# Patient Record
Sex: Male | Born: 1950
Health system: Southern US, Community
[De-identification: ages and names within clinical notes are randomized; demographics above are authoritative.]

## PROBLEM LIST (undated history)

## (undated) DIAGNOSIS — K219 Gastro-esophageal reflux disease without esophagitis: Secondary | ICD-10-CM

## (undated) DIAGNOSIS — M1990 Unspecified osteoarthritis, unspecified site: Secondary | ICD-10-CM

## (undated) DIAGNOSIS — I10 Essential (primary) hypertension: Secondary | ICD-10-CM

## (undated) HISTORY — PX: KNEE CARTILAGE SURGERY: SHX688

## (undated) HISTORY — PX: CATARACT EXTRACTION: SUR2

## (undated) HISTORY — PX: KNEE ARTHROSCOPY: SUR90

---

## 2006-03-13 ENCOUNTER — Encounter: Admission: RE | Admit: 2006-03-13 | Discharge: 2006-03-13 | Payer: Self-pay | Admitting: Family Medicine

## 2008-06-08 ENCOUNTER — Encounter: Admission: RE | Admit: 2008-06-08 | Discharge: 2008-06-08 | Payer: Self-pay | Admitting: Family Medicine

## 2010-12-27 ENCOUNTER — Other Ambulatory Visit: Payer: Self-pay | Admitting: Orthopedic Surgery

## 2011-01-02 ENCOUNTER — Encounter (HOSPITAL_COMMUNITY): Payer: Self-pay | Admitting: Pharmacy Technician

## 2011-01-07 ENCOUNTER — Other Ambulatory Visit: Payer: Self-pay

## 2011-01-07 ENCOUNTER — Encounter (HOSPITAL_COMMUNITY): Payer: Self-pay

## 2011-01-07 ENCOUNTER — Encounter (HOSPITAL_COMMUNITY)
Admission: RE | Admit: 2011-01-07 | Discharge: 2011-01-07 | Disposition: A | Discharge status: Home / Self Care | Payer: BC Managed Care – PPO | Source: Ambulatory Visit | Attending: Orthopedic Surgery | Admitting: Orthopedic Surgery

## 2011-01-07 ENCOUNTER — Ambulatory Visit (HOSPITAL_COMMUNITY): Admission: RE | Admit: 2011-01-07 | Payer: BC Managed Care – PPO | Source: Ambulatory Visit

## 2011-01-07 HISTORY — DX: Essential (primary) hypertension: I10

## 2011-01-07 HISTORY — DX: Unspecified osteoarthritis, unspecified site: M19.90

## 2011-01-07 LAB — COMPREHENSIVE METABOLIC PANEL
BUN: 14 mg/dL (ref 6–23)
CO2: 28 mEq/L (ref 19–32)
Chloride: 100 mEq/L (ref 96–112)
Creatinine, Ser: 0.92 mg/dL (ref 0.50–1.35)
GFR calc Af Amer: >90 mL/min (ref >90)
GFR calc non Af Amer: 89 mL/min — ABNORMAL LOW (ref >90)
Glucose, Bld: 122 mg/dL — ABNORMAL HIGH (ref 70–99)
Total Bilirubin: 0.4 mg/dL (ref 0.3–1.2)

## 2011-01-07 LAB — DIFFERENTIAL
Basophils Absolute: 0 10*3/uL (ref 0.0–0.1)
Basophils Relative: 0 % (ref 0–1)
Neutro Abs: 6.4 10*3/uL (ref 1.7–7.7)
Neutrophils Relative %: 61 % (ref 43–77)

## 2011-01-07 LAB — PROTIME-INR: INR: 0.98 (ref 0.00–1.49)

## 2011-01-07 LAB — URINALYSIS, ROUTINE W REFLEX MICROSCOPIC
Bilirubin Urine: NEGATIVE
Glucose, UA: NEGATIVE mg/dL
Ketones, ur: NEGATIVE mg/dL
Nitrite: NEGATIVE
Protein, ur: NEGATIVE mg/dL

## 2011-01-07 LAB — CBC
HCT: 37.9 % — ABNORMAL LOW (ref 39.0–52.0)
MCV: 87.1 fL (ref 78.0–100.0)
RBC: 4.35 MIL/uL (ref 4.22–5.81)
RDW: 12.2 % (ref 11.5–15.5)
WBC: 10.4 10*3/uL (ref 4.0–10.5)

## 2011-01-07 LAB — URINE MICROSCOPIC-ADD ON

## 2011-01-07 NOTE — Pre-Procedure Instructions (Signed)
18 Mitchell Frank  01/07/2011   Your procedure is scheduled on: 01-16-2011 @ 8:45 AM  Report to [facility] at 6:30 AM.  Call this number if you have problems the morning of surgery: [phone]   Remember:   Do not eat food:After Midnight.  May have clear liquids: up to 4 Hours before arrival.  Clear liquids include soda, tea, black coffee, apple or grape juice, broth. Until 2:30 AM  Take these medicines the morning of surgery with A SIP OF WATER Amlodipine,   Do not wear jewelry, make-up or nail polish.  Do not wear lotions, powders, or perfumes. You may wear deodorant.  Do not shave 48 hours prior to surgery.  Do not bring valuables to the hospital.  Contacts, dentures or bridgework may not be worn into surgery.  Leave suitcase in the car. After surgery it may be brought to your room.  For patients admitted to the hospital, checkout time is 11:00 AM the day of discharge.   Patients discharged the day of surgery will not be allowed to drive home.  Name and phone number of your driver:  Special Instructions: CHG Shower Use Special Wash: 1/2 bottle night before surgery and 1/2 bottle morning of surgery.   Please read over the following fact sheets that you were given: Pain Booklet, Blood Transfusion Information, MRSA Information and Surgical Site Infection Prevention

## 2011-01-07 NOTE — Progress Notes (Signed)
Carla from Dr. Marshell Levan office to place new order for consent form in Epic.  Pt. Does  Not to have T&S done until DOS due to his job.he cannot wear the bracelet.

## 2011-01-07 NOTE — Progress Notes (Signed)
Office called for clarification of consent.

## 2011-01-15 MED ORDER — CHLORHEXIDINE GLUCONATE 4 % EX LIQD: 60.0000 mL | Freq: Once | CUTANEOUS | Status: DC

## 2011-01-15 MED ORDER — CEFAZOLIN SODIUM-DEXTROSE 2-3 GM-% IV SOLR
2.0000 g | INTRAVENOUS | Status: AC
  Administered 2011-01-16: 2 g via INTRAVENOUS

## 2011-01-16 ENCOUNTER — Ambulatory Visit (HOSPITAL_COMMUNITY): Payer: BC Managed Care – PPO

## 2011-01-16 ENCOUNTER — Encounter (HOSPITAL_COMMUNITY): Payer: Self-pay | Admitting: *Deleted

## 2011-01-16 ENCOUNTER — Encounter (HOSPITAL_COMMUNITY)
Admission: RE | Disposition: A | Discharge status: Home / Self Care | Payer: Self-pay | Source: Ambulatory Visit | Attending: Orthopedic Surgery

## 2011-01-16 ENCOUNTER — Ambulatory Visit (HOSPITAL_COMMUNITY): Payer: BC Managed Care – PPO | Admitting: Anesthesiology

## 2011-01-16 ENCOUNTER — Encounter (HOSPITAL_COMMUNITY): Payer: Self-pay | Admitting: Anesthesiology

## 2011-01-16 ENCOUNTER — Inpatient Hospital Stay (HOSPITAL_COMMUNITY)
Admission: RE | Admit: 2011-01-16 | Discharge: 2011-01-17 | DRG: 865 | Disposition: A | Discharge status: Home / Self Care | Payer: BC Managed Care – PPO | Source: Ambulatory Visit | Attending: Orthopedic Surgery | Admitting: Orthopedic Surgery

## 2011-01-16 DIAGNOSIS — Z87720 Personal history of (corrected) congenital malformations of eye: Secondary | ICD-10-CM

## 2011-01-16 DIAGNOSIS — M5410 Radiculopathy, site unspecified: Secondary | ICD-10-CM

## 2011-01-16 DIAGNOSIS — Z9889 Other specified postprocedural states: Secondary | ICD-10-CM

## 2011-01-16 DIAGNOSIS — I10 Essential (primary) hypertension: Secondary | ICD-10-CM | POA: Diagnosis present

## 2011-01-16 DIAGNOSIS — M5030 Other cervical disc degeneration, unspecified cervical region: Principal | ICD-10-CM | POA: Diagnosis present

## 2011-01-16 DIAGNOSIS — E119 Type 2 diabetes mellitus without complications: Secondary | ICD-10-CM | POA: Diagnosis present

## 2011-01-16 HISTORY — PX: ANTERIOR CERVICAL DECOMP/DISCECTOMY FUSION: SHX1161

## 2011-01-16 LAB — TYPE AND SCREEN: Antibody Screen: NEGATIVE

## 2011-01-16 LAB — GLUCOSE, CAPILLARY
Glucose-Capillary: 134 mg/dL — ABNORMAL HIGH (ref 70–99)
Glucose-Capillary: 151 mg/dL — ABNORMAL HIGH (ref 70–99)
Glucose-Capillary: 167 mg/dL — ABNORMAL HIGH (ref 70–99)
Glucose-Capillary: 141 mg/dL — ABNORMAL HIGH (ref 70–99)

## 2011-01-16 SURGERY — ANTERIOR CERVICAL DECOMPRESSION/DISCECTOMY FUSION 2 LEVELS
Anesthesia: General | Site: Spine Cervical | Laterality: Left | Wound class: Clean

## 2011-01-16 MED ORDER — HYDROXYZINE HCL 25 MG PO TABS: 50.0000 mg | ORAL_TABLET | ORAL | Status: DC | PRN

## 2011-01-16 MED ORDER — CEFAZOLIN SODIUM 1-5 GM-% IV SOLN
INTRAVENOUS | Status: AC
  Filled 2011-01-16: qty 100

## 2011-01-16 MED ORDER — LISINOPRIL 10 MG PO TABS
10.0000 mg | ORAL_TABLET | Freq: Every day | ORAL | Status: DC
  Administered 2011-01-16: 10 mg via ORAL
  Filled 2011-01-16 (×2): qty 1

## 2011-01-16 MED ORDER — POTASSIUM CHLORIDE IN NACL 20-0.9 MEQ/L-% IV SOLN
INTRAVENOUS | Status: DC
  Administered 2011-01-16: 17:00:00 via INTRAVENOUS
  Filled 2011-01-16 (×3): qty 1000

## 2011-01-16 MED ORDER — CEFAZOLIN SODIUM 1-5 GM-% IV SOLN
1.0000 g | Freq: Three times a day (TID) | INTRAVENOUS | Status: AC
  Administered 2011-01-16 (×2): 1 g via INTRAVENOUS
  Filled 2011-01-16 (×2): qty 50

## 2011-01-16 MED ORDER — PHENOL 1.4 % MT LIQD
1.0000 | OROMUCOSAL | Status: DC | PRN
  Administered 2011-01-16: 1 via OROMUCOSAL
  Filled 2011-01-16: qty 177

## 2011-01-16 MED ORDER — ACETAMINOPHEN 10 MG/ML IV SOLN
INTRAVENOUS | Status: DC | PRN
  Administered 2011-01-16: 1000 mg via INTRAVENOUS

## 2011-01-16 MED ORDER — NEOSTIGMINE METHYLSULFATE 1 MG/ML IJ SOLN
INTRAMUSCULAR | Status: DC | PRN
  Administered 2011-01-16: 3 mg via INTRAVENOUS

## 2011-01-16 MED ORDER — OXYCODONE-ACETAMINOPHEN 5-325 MG PO TABS
1.0000 | ORAL_TABLET | ORAL | Status: DC | PRN
  Administered 2011-01-16: 1 via ORAL
  Filled 2011-01-16: qty 1

## 2011-01-16 MED ORDER — THROMBIN 20000 UNITS EX KIT
PACK | OROMUCOSAL | Status: DC | PRN
  Administered 2011-01-16: 09:00:00 via TOPICAL

## 2011-01-16 MED ORDER — ACETAMINOPHEN 10 MG/ML IV SOLN
INTRAVENOUS | Status: AC
  Filled 2011-01-16: qty 100

## 2011-01-16 MED ORDER — FENTANYL CITRATE 0.05 MG/ML IJ SOLN
INTRAMUSCULAR | Status: DC | PRN
  Administered 2011-01-16: 100 ug via INTRAVENOUS
  Administered 2011-01-16: 150 ug via INTRAVENOUS

## 2011-01-16 MED ORDER — SODIUM CHLORIDE 0.9 % IJ SOLN
3.0000 mL | Freq: Two times a day (BID) | INTRAMUSCULAR | Status: DC
  Administered 2011-01-16 (×2): 3 mL via INTRAVENOUS

## 2011-01-16 MED ORDER — METFORMIN HCL 500 MG PO TABS
1000.0000 mg | ORAL_TABLET | Freq: Every day | ORAL | Status: DC
  Filled 2011-01-16: qty 2

## 2011-01-16 MED ORDER — MENTHOL 3 MG MT LOZG
1.0000 | LOZENGE | OROMUCOSAL | Status: DC | PRN
  Administered 2011-01-16: 3 mg via ORAL
  Filled 2011-01-16: qty 9

## 2011-01-16 MED ORDER — BUPIVACAINE-EPINEPHRINE 0.25% -1:200000 IJ SOLN
INTRAMUSCULAR | Status: DC | PRN
  Administered 2011-01-16: 1 mL

## 2011-01-16 MED ORDER — ACETAMINOPHEN 650 MG RE SUPP: 650.0000 mg | RECTAL | Status: DC | PRN

## 2011-01-16 MED ORDER — METFORMIN HCL 500 MG PO TABS: 500.0000 mg | ORAL_TABLET | ORAL | Status: DC

## 2011-01-16 MED ORDER — MIDAZOLAM HCL 5 MG/5ML IJ SOLN
INTRAMUSCULAR | Status: DC | PRN
  Administered 2011-01-16: 2 mg via INTRAVENOUS

## 2011-01-16 MED ORDER — SODIUM CHLORIDE 0.9 % IJ SOLN: 3.0000 mL | INTRAMUSCULAR | Status: DC | PRN

## 2011-01-16 MED ORDER — 0.9 % SODIUM CHLORIDE (POUR BTL) OPTIME
TOPICAL | Status: DC | PRN
  Administered 2011-01-16: 1000 mL

## 2011-01-16 MED ORDER — METFORMIN HCL 500 MG PO TABS
500.0000 mg | ORAL_TABLET | Freq: Every day | ORAL | Status: DC
  Administered 2011-01-16: 500 mg via ORAL
  Filled 2011-01-16 (×2): qty 1

## 2011-01-16 MED ORDER — HYDROMORPHONE HCL PF 1 MG/ML IJ SOLN: 0.2500 mg | INTRAMUSCULAR | Status: DC | PRN

## 2011-01-16 MED ORDER — MORPHINE SULFATE 2 MG/ML IJ SOLN: 0.0500 mg/kg | INTRAMUSCULAR | Status: DC | PRN

## 2011-01-16 MED ORDER — ROCURONIUM BROMIDE 100 MG/10ML IV SOLN
INTRAVENOUS | Status: DC | PRN
  Administered 2011-01-16 (×4): 10 mg via INTRAVENOUS
  Administered 2011-01-16: 50 mg via INTRAVENOUS

## 2011-01-16 MED ORDER — POVIDONE-IODINE 7.5 % EX SOLN: Freq: Once | CUTANEOUS | Status: DC

## 2011-01-16 MED ORDER — ONDANSETRON HCL 4 MG/2ML IJ SOLN: 4.0000 mg | Freq: Once | INTRAMUSCULAR | Status: DC | PRN

## 2011-01-16 MED ORDER — AMLODIPINE BESYLATE 10 MG PO TABS
10.0000 mg | ORAL_TABLET | Freq: Every day | ORAL | Status: DC
  Administered 2011-01-16: 10 mg via ORAL
  Filled 2011-01-16 (×2): qty 1

## 2011-01-16 MED ORDER — ZOLPIDEM TARTRATE 5 MG PO TABS: 5.0000 mg | ORAL_TABLET | Freq: Every evening | ORAL | Status: DC | PRN

## 2011-01-16 MED ORDER — ACETAMINOPHEN 325 MG PO TABS: 650.0000 mg | ORAL_TABLET | ORAL | Status: DC | PRN

## 2011-01-16 MED ORDER — LACTATED RINGERS IV SOLN
INTRAVENOUS | Status: DC | PRN
  Administered 2011-01-16 (×2): via INTRAVENOUS

## 2011-01-16 MED ORDER — ONDANSETRON HCL 4 MG/2ML IJ SOLN
INTRAMUSCULAR | Status: DC | PRN
  Administered 2011-01-16: 4 mg via INTRAVENOUS

## 2011-01-16 MED ORDER — GLYCOPYRROLATE 0.2 MG/ML IJ SOLN
INTRAMUSCULAR | Status: DC | PRN
  Administered 2011-01-16: .6 mg via INTRAVENOUS

## 2011-01-16 MED ORDER — INSULIN ASPART 100 UNIT/ML ~~LOC~~ SOLN
0.0000 [IU] | Freq: Every day | SUBCUTANEOUS | Status: DC
  Filled 2011-01-16: qty 3

## 2011-01-16 MED ORDER — INSULIN ASPART 100 UNIT/ML ~~LOC~~ SOLN
0.0000 [IU] | Freq: Three times a day (TID) | SUBCUTANEOUS | Status: DC
  Administered 2011-01-16: 3 [IU] via SUBCUTANEOUS
  Administered 2011-01-17: 2 [IU] via SUBCUTANEOUS
  Filled 2011-01-16: qty 3

## 2011-01-16 MED ORDER — MEPERIDINE HCL 25 MG/ML IJ SOLN: 6.2500 mg | INTRAMUSCULAR | Status: DC | PRN

## 2011-01-16 MED ORDER — EPHEDRINE SULFATE 50 MG/ML IJ SOLN
INTRAMUSCULAR | Status: DC | PRN
  Administered 2011-01-16: 5 mg via INTRAVENOUS

## 2011-01-16 MED ORDER — HYDROXYZINE HCL 50 MG/ML IM SOLN: 50.0000 mg | INTRAMUSCULAR | Status: DC | PRN

## 2011-01-16 MED ORDER — MORPHINE SULFATE 4 MG/ML IJ SOLN: 2.0000 mg | INTRAMUSCULAR | Status: DC | PRN

## 2011-01-16 MED ORDER — DIAZEPAM 5 MG PO TABS
5.0000 mg | ORAL_TABLET | Freq: Four times a day (QID) | ORAL | Status: DC | PRN
  Administered 2011-01-16 – 2011-01-17 (×3): 5 mg via ORAL
  Filled 2011-01-16 (×3): qty 1

## 2011-01-16 MED ORDER — PROPOFOL 10 MG/ML IV EMUL
INTRAVENOUS | Status: DC | PRN
  Administered 2011-01-16: 150 mg via INTRAVENOUS

## 2011-01-16 SURGICAL SUPPLY — 75 items
APL SKNCLS STERI-STRIP NONHPOA (GAUZE/BANDAGES/DRESSINGS) ×1
BENZOIN TINCTURE PRP APPL 2/3 (GAUZE/BANDAGES/DRESSINGS) ×2 IMPLANT
BIT DRILL NEURO 2X3.1 SFT TUCH (MISCELLANEOUS) ×1 IMPLANT
BLADE LONG MED 31X9 (MISCELLANEOUS) IMPLANT
BLADE SURG 15 STRL LF DISP TIS (BLADE) ×1 IMPLANT
BLADE SURG 15 STRL SS (BLADE) ×2
BLADE SURG ROTATE 9660 (MISCELLANEOUS) ×2 IMPLANT
BUR NEURO DRILL SOFT 3.0X3.8M (BURR) ×2 IMPLANT
CARTRIDGE OIL MAESTRO DRILL (MISCELLANEOUS) ×1 IMPLANT
CLOTH BEACON ORANGE TIMEOUT ST (SAFETY) ×2 IMPLANT
COLLAR CERV LO CONTOUR FIRM DE (SOFTGOODS) IMPLANT
CORDS BIPOLAR (ELECTRODE) ×2 IMPLANT
COVER SURGICAL LIGHT HANDLE (MISCELLANEOUS) ×2 IMPLANT
CRADLE DONUT ADULT HEAD (MISCELLANEOUS) ×2 IMPLANT
DEVICE ENDSKLTN MED 6 7MM (Orthopedic Implant) IMPLANT
DEVICE ENDSKLTN TC MED 8MM (Orthopedic Implant) IMPLANT
DIFFUSER DRILL AIR PNEUMATIC (MISCELLANEOUS) ×2 IMPLANT
DRAIN JACKSON RD 7FR 3/32 (WOUND CARE) ×1 IMPLANT
DRAPE C-ARM 42X72 X-RAY (DRAPES) ×2 IMPLANT
DRAPE POUCH INSTRU U-SHP 10X18 (DRAPES) ×2 IMPLANT
DRAPE SURG 17X23 STRL (DRAPES) ×6 IMPLANT
DRILL NEURO 2X3.1 SOFT TOUCH (MISCELLANEOUS) ×2
DURAPREP 6ML APPLICATOR 50/CS (WOUND CARE) ×2 IMPLANT
ELECT COATED BLADE 2.86 ST (ELECTRODE) ×2 IMPLANT
ELECT REM PT RETURN 9FT ADLT (ELECTROSURGICAL) ×2
ELECTRODE REM PT RTRN 9FT ADLT (ELECTROSURGICAL) ×1 IMPLANT
ENDOSKELETON MED 6 7MM (Orthopedic Implant) ×2 IMPLANT
ENDOSKELTON TC IMPLANT 8MM MED (Orthopedic Implant) ×2 IMPLANT
EVACUATOR SILICONE 100CC (DRAIN) ×1 IMPLANT
GAUZE SPONGE 4X4 16PLY XRAY LF (GAUZE/BANDAGES/DRESSINGS) ×1 IMPLANT
GLOVE BIO SURGEON STRL SZ8 (GLOVE) ×2 IMPLANT
GLOVE BIOGEL PI IND STRL 8 (GLOVE) ×1 IMPLANT
GLOVE BIOGEL PI INDICATOR 8 (GLOVE) ×1
GOWN PREVENTION PLUS XLARGE (GOWN DISPOSABLE) ×5 IMPLANT
GOWN STRL NON-REIN LRG LVL3 (GOWN DISPOSABLE) ×1 IMPLANT
IV CATH 14GX2 1/4 (CATHETERS) ×2 IMPLANT
KIT BASIN OR (CUSTOM PROCEDURE TRAY) ×2 IMPLANT
KIT ROOM TURNOVER OR (KITS) ×2 IMPLANT
MANIFOLD NEPTUNE II (INSTRUMENTS) ×2 IMPLANT
NDL SPNL 20GX3.5 QUINCKE YW (NEEDLE) ×1 IMPLANT
NEEDLE 27GAX1X1/2 (NEEDLE) ×2 IMPLANT
NEEDLE SPNL 20GX3.5 QUINCKE YW (NEEDLE) ×2 IMPLANT
NS IRRIG 1000ML POUR BTL (IV SOLUTION) ×2 IMPLANT
OIL CARTRIDGE MAESTRO DRILL (MISCELLANEOUS) ×2
PACK ORTHO CERVICAL (CUSTOM PROCEDURE TRAY) ×2 IMPLANT
PAD ARMBOARD 7.5X6 YLW CONV (MISCELLANEOUS) ×4 IMPLANT
PATTIES SURGICAL .5 X.5 (GAUZE/BANDAGES/DRESSINGS) ×1 IMPLANT
PATTIES SURGICAL .5 X1 (DISPOSABLE) IMPLANT
PIN RETAINER PRODISC 14 MM (PIN) ×2 IMPLANT
PLATE VECTRA (Plate) ×1 IMPLANT
PUTTY BONE DBX 2.5 MIS (Bone Implant) ×1 IMPLANT
RETAINER SCREW ×2 IMPLANT
SCREW 4.5X16MM (Screw) ×12 IMPLANT
SCREW BN 16X4.5XSLF DRL VA (Screw) IMPLANT
SPONGE GAUZE 4X4 12PLY (GAUZE/BANDAGES/DRESSINGS) ×2 IMPLANT
SPONGE INTESTINAL PEANUT (DISPOSABLE) ×4 IMPLANT
SPONGE SURGIFOAM ABS GEL 100 (HEMOSTASIS) ×2 IMPLANT
STRIP CLOSURE SKIN 1/2X4 (GAUZE/BANDAGES/DRESSINGS) ×2 IMPLANT
SURGIFLO TRUKIT (HEMOSTASIS) IMPLANT
SUT MNCRL AB 4-0 PS2 18 (SUTURE) ×2 IMPLANT
SUT SILK 2 0 TIES 10X30 (SUTURE) ×1 IMPLANT
SUT SILK 4 0 (SUTURE)
SUT SILK 4-0 18XBRD TIE 12 (SUTURE) IMPLANT
SUT VIC AB 1 CT1 27 (SUTURE)
SUT VIC AB 1 CT1 27XBRD ANBCTR (SUTURE) ×1 IMPLANT
SUT VIC AB 2-0 CT2 18 VCP726D (SUTURE) ×2 IMPLANT
SYR BULB IRRIGATION 50ML (SYRINGE) ×2 IMPLANT
SYR CONTROL 10ML LL (SYRINGE) ×2 IMPLANT
TAPE CLOTH 4X10 WHT NS (GAUZE/BANDAGES/DRESSINGS) ×1 IMPLANT
TAPE CLOTH SURG 4X10 WHT LF (GAUZE/BANDAGES/DRESSINGS) ×1 IMPLANT
TAPE UMBILICAL COTTON 1/8X30 (MISCELLANEOUS) ×2 IMPLANT
TOWEL OR 17X24 6PK STRL BLUE (TOWEL DISPOSABLE) ×2 IMPLANT
TOWEL OR 17X26 10 PK STRL BLUE (TOWEL DISPOSABLE) ×2 IMPLANT
WATER STERILE IRR 1000ML POUR (IV SOLUTION) ×2 IMPLANT
YANKAUER SUCT BULB TIP NO VENT (SUCTIONS) ×2 IMPLANT

## 2011-01-16 NOTE — Anesthesia Postprocedure Evaluation (Signed)
  Anesthesia Post-op Note  Patient: Mitchell Frank  Procedure(s) Performed:  ANTERIOR CERVICAL DECOMPRESSION/DISCECTOMY FUSION 2 LEVELS - Anterior cervical decompression fusion, cervical 3-4, cervical 4-5 with instrumentation and allograft.  Patient Location: PACU  Anesthesia Type: General  Level of Consciousness: awake, alert  and oriented  Airway and Oxygen Therapy: Patient Spontanous Breathing and Patient connected to face mask oxygen  Post-op Pain: mild  Post-op Assessment: Post-op Vital signs reviewed, Patient's Cardiovascular Status Stable, Respiratory Function Stable, Patent Airway, No signs of Nausea or vomiting and Pain level controlled  Post-op Vital Signs: Reviewed and stable  Complications: No apparent anesthesia complications

## 2011-01-16 NOTE — Transfer of Care (Signed)
Immediate Anesthesia Transfer of Care Note  Patient: Mitchell Frank  Procedure(s) Performed:  ANTERIOR CERVICAL DECOMPRESSION/DISCECTOMY FUSION 2 LEVELS - Anterior cervical decompression fusion, cervical 3-4, cervical 4-5 with instrumentation and allograft.  Patient Location: PACU  Anesthesia Type: General  Level of Consciousness: sedated and patient cooperative  Airway & Oxygen Therapy: Patient Spontanous Breathing and Patient connected to face mask oxygen  Post-op Assessment: Report given to PACU RN and Post -op Vital signs reviewed and stable  Post vital signs: Reviewed and stable  Complications: No apparent anesthesia complications

## 2011-01-16 NOTE — Anesthesia Preprocedure Evaluation (Addendum)
Anesthesia Evaluation  Patient identified by MRN, date of birth, ID band Patient awake    Reviewed: Allergy & Precautions, H&P , NPO status , Patient's Chart, lab work & pertinent test results  Airway Mallampati: I TM Distance: >3 FB Neck ROM: Full    Dental No notable dental hx. (+) Teeth Intact and Dental Advisory Given   Pulmonary          Cardiovascular hypertension, Regular Normal    Neuro/Psych    GI/Hepatic   Endo/Other  Diabetes mellitus-, Well Controlled, Type 2, Oral Hypoglycemic Agents  Renal/GU      Musculoskeletal   Abdominal   Peds  Hematology   Anesthesia Other Findings   Reproductive/Obstetrics                          Anesthesia Physical Anesthesia Plan  ASA: II  Anesthesia Plan: General   Post-op Pain Management:    Induction: Intravenous  Airway Management Planned: Oral ETT  Additional Equipment:   Intra-op Plan:   Post-operative Plan: Extubation in OR  Informed Consent: I have reviewed the patients History and Physical, chart, labs and discussed the procedure including the risks, benefits and alternatives for the proposed anesthesia with the patient or authorized representative who has indicated his/her understanding and acceptance.   Dental advisory given  Plan Discussed with: CRNA, Anesthesiologist and Surgeon  Anesthesia Plan Comments:         Anesthesia Quick Evaluation

## 2011-01-16 NOTE — H&P (Signed)
PREOPERATIVE H&P  Chief Complaint: left arm pain    HPI: Mitchell Frank is a 60 y.o. male who presents for with many year history of pain in left shoulder and arm  Past Medical History  Diagnosis Date  . Diabetes mellitus   . Hypertension   . Arthritis    Past Surgical History  Procedure Date  . Knee cartilage surgery     left knee  . Knee arthroscopy     right  . Cataract extraction     right   History   Social History  . Marital Status: Married    Spouse Name: N/A    Number of Children: N/A  . Years of Education: N/A   Social History Main Topics  . Smoking status: Never Smoker   . Smokeless tobacco: None  . Alcohol Use: No  . Drug Use: No  . Sexually Active:    Other Topics Concern  . None   Social History Narrative  . None   History reviewed. No pertinent family history. Allergies  Allergen Reactions  . Sulfa Antibiotics Other (See Comments)    BLADDER ISSUES   Prior to Admission medications   Medication Sig Start Date End Date Taking? Authorizing Provider  amLODipine (NORVASC) 10 MG tablet Take 10 mg by mouth daily.     Yes Historical Provider, MD  fish oil-omega-3 fatty acids 1000 MG capsule Take 2,400 mg by mouth daily.     Yes Historical Provider, MD  lisinopril (PRINIVIL,ZESTRIL) 10 MG tablet Take 10 mg by mouth daily.     Yes Historical Provider, MD  metFORMIN (GLUCOPHAGE) 500 MG tablet Take 500 mg by mouth as directed. 2 TABS AT LUNCH 1 TAB IN EVENING    Yes Historical Provider, MD  metroNIDAZOLE (METROGEL) 0.75 % gel Apply 1 application topically 2 (two) times daily as needed. FOR ROSACEA OUTBREAK (FACE)    Yes Historical Provider, MD     All other systems have been reviewed and were otherwise negative with the exception of those mentioned in the HPI and as above.  Physical Exam: There were no vitals filed for this visit.  General: Alert, no acute distress Cardiovascular: No pedal edema Respiratory: No cyanosis, no use of accessory  musculature GI: No organomegaly, abdomen is soft and non-tender Skin: No lesions in the area of chief complaint Neurologic: Sensation intact distally Psychiatric: Patient is competent for consent with normal mood and affect Lymphatic: No axillary or cervical lymphadenopathy  MUSCULOSKELETAL: 5/5 strength bilaterally  Assessment/Plan: Cervical radiculopathy Plan for Procedure(s): ANTERIOR CERVICAL DECOMPRESSION/DISCECTOMY FUSION C3-C5   Pema Thomure LEONARD 01/16/2011 8:12 AM

## 2011-01-16 NOTE — OR Nursing (Signed)
Arrived pacu lethargic and poor resp effort depsite prodding...changed to Sm from nasal cannula and responding to verbal stim would deep breathe and cough

## 2011-01-16 NOTE — Anesthesia Procedure Notes (Signed)
Procedure Name: Intubation Date/Time: 01/16/2011 8:47 AM Performed by: Gwenyth Allegra Pre-anesthesia Checklist: Patient identified, Timeout performed, Emergency Drugs available, Suction available and Patient being monitored Patient Re-evaluated:Patient Re-evaluated prior to inductionOxygen Delivery Method: Circle System Utilized Preoxygenation: Pre-oxygenation with 100% oxygen Intubation Type: IV induction Ventilation: Mask ventilation without difficulty and Oral airway inserted - appropriate to patient size Laryngoscope Size: Mac and 3 Tube size: 7.5 mm Number of attempts: 1 Placement Confirmation: ETT inserted through vocal cords under direct vision,  breath sounds checked- equal and bilateral and positive ETCO2 Secured at: 22 cm Tube secured with: Tape Dental Injury: Teeth and Oropharynx as per pre-operative assessment

## 2011-01-16 NOTE — Preoperative (Signed)
Beta Blockers   Reason not to administer Beta Blockers:Not Applicable 

## 2011-01-16 NOTE — OR Nursing (Signed)
Relates neck only hurts when he has tocough

## 2011-01-16 NOTE — OR Nursing (Signed)
Seen by dr crews and signed out / resp effort much improved as are spontaneous interactions with staff

## 2011-01-17 ENCOUNTER — Encounter (HOSPITAL_COMMUNITY): Payer: Self-pay | Admitting: Orthopedic Surgery

## 2011-01-17 LAB — GLUCOSE, CAPILLARY: Glucose-Capillary: 133 mg/dL — ABNORMAL HIGH (ref 70–99)

## 2011-01-17 MED ORDER — OXYCODONE-ACETAMINOPHEN 5-325 MG PO TABS: 1.0000 | ORAL_TABLET | ORAL | Status: AC | PRN

## 2011-01-17 MED ORDER — DIAZEPAM 5 MG PO TABS: 5.0000 mg | ORAL_TABLET | Freq: Four times a day (QID) | ORAL | Status: AC | PRN

## 2011-01-17 NOTE — Progress Notes (Signed)
PT well.  Minimal neck discomfort. + throat discomfort Left arm pain resolved.  AVSS Collar well appplied Dressing CDI  POD #1 after C34, C45 ACDF   - d/c home today - follow-up in 2 weeks - aspen at all times

## 2011-01-17 NOTE — Plan of Care (Signed)
Problem: Phase II Progression Outcomes Goal: Understands assist devices with ambulation Outcome: Completed/Met Date Met:  01/17/11 +

## 2011-01-17 NOTE — Discharge Summary (Signed)
NAMEADIS, STURGILL NO.:  [id number]  MEDICAL RECORD NO.:  [id number]  LOCATION:  3526                         FACILITY:  [facility]  PHYSICIAN:  Estill Bamberg, MD      DATE OF BIRTH:  1950-08-12  DATE OF ADMISSION:  01/16/2011 DATE OF DISCHARGE:  01/17/2011                              DISCHARGE SUMMARY   ADMISSION DIAGNOSES: 1. Left-sided cervical radiculopathy. 2. C3-4, C4-5 degenerative disk disease.  DISCHARGE DIAGNOSES: 1. Left-sided cervical radiculopathy. 2. C3-4, C4-5 degenerative disk disease.  ADMITTING PHYSICIAN:  Estill Bamberg, MD  ADMISSION HISTORY:  Briefly, Mr. Mitchell Frank is a very pleasant 60 year old male who presented to me with severe pain in the left arm.  I did review an MRI which was notable for degenerative disk disease associated with the C3-4 and C4-5 levels.  The patient was clearly noted to have significant neural foraminal stenosis on the left side at the C3-4 level and was noted to have a congenital fusion at C5-6.  The patient was therefore admitted on January 16, 2011 for a 2-level anterior cervical decompression and fusion.  HOSPITAL COURSE:  On January 16, 2011, the patient underwent the procedure noted above.  The patient tolerated the procedure well and was transferred to recovery in stable condition.  The patient was ultimately transferred to the general floor in stable condition.  A hard Aspen cervical collar was placed postoperatively.  The patient was evaluated by me on the morning of postop day #1.  The patient was clear in stating that his previous radicular pain in the left arm was entirely resolved. He did have some minimal neck discomfort and swallowing difficulty, as expected.  The patient's pain was well controlled and the patient was uneventfully discharged home on the morning of postoperative day #1.  DISCHARGE INSTRUCTIONS:  The patient will wear a hard Aspen cervical collar at all times.  The patient will  avoid lifting over 10 pounds. The patient will take Percocet for pain and Valium for spasms.  The patient will follow up in my office in approximately 2 weeks after his procedure.    Estill Bamberg, MD    MD/MEDQ  D:  01/17/2011  T:  01/17/2011  Job:  [id number]  cc:   Enrigue Catena

## 2011-01-17 NOTE — Op Note (Signed)
Mitchell Frank, Mitchell Frank NO.:  [id number]  MEDICAL RECORD NO.:  [id number]  LOCATION:  3526                         FACILITY:  [facility]  PHYSICIAN:  Estill Bamberg, MD      DATE OF BIRTH:  05-11-1950  DATE OF PROCEDURE:  01/16/2011 DATE OF DISCHARGE:                              OPERATIVE REPORT   PREOPERATIVE DIAGNOSES: 1. C3-4, C4-5 degenerative disk disease. 2. Left-sided C4 radiculopathy. 3. 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  POSTOPERATIVE DIAGNOSES: 1. C3-4, C4-5 degenerative disk disease. 2. Left-sided C4 radiculopathy. 3. 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  PROCEDURES: 1. C3-4, C4-5 anterior cervical decompression and fusion. 2. Placement of interbody device x2 (Titan lordotic interbody cages, 7     mm at C4-5 and 8 mm at C3-4). 3. Use of local autograft. 4. Use of morselized allograft. 5. Placement of anterior instrumentation C3, C4, C5. 6. Intraoperative use of fluoroscopy.  SURGEON:  Estill Bamberg, MD  ASSISTANT:  Janace Litten, PA  ANESTHESIA:  General endotracheal anesthesia.  COMPLICATIONS:  None.  DISPOSITION:  Stable.  ESTIMATED BLOOD LOSS:  Minimal.  INDICATIONS FOR PROCEDURE:  Briefly, Mr. Suen is a very pleasant 60- year-old male, who initially presented to me on November 09, 2010 with significant pain in the region of his left shoulder going into his arm. I did review an MRI which was notable for rather significant foraminal stenosis at the C3-4 level on the left side.  The patient was also noted to have a congenital fusion associated with the C5-6 level.  I did feel the patient's symptoms were likely related to his left-sided C4 radiculopathy and the patient did go forward with getting a left-sided L4 selective nerve block.  The patient stated that the nerve block did entirely alleviate the symptoms in his left shoulder and left arm.  I therefore did feel confident that his symptoms were secondary to left- sided L4 insert a left-sided C4  radiculopathy.  Given the patient's failure of conservative care and his ongoing discomfort, we did have a discussion regarding going forward with an anterior-cervical decompression and fusion involving the C3-4 and C4-5 levels.  The patient fully understood the risks and limitations of the procedure as outlined in my preoperative note.  OPERATIVE DETAILS:  On January 16, 2011, the patient was brought to Surgery, and general endotracheal anesthesia was administered.  The patient was placed supine on a well-padded hospital bed.  All bony prominences were meticulously padded and the patient's arms were secured to the sides.  The neck was placed in a gentle degree of extension.  I then prepped and draped the neck in the usual sterile fashion.  SCDs were placed and time-out procedure was performed.  I then brought in lateral fluoroscopy in order to help optimize the location of the patient's incision.  I then made a left-sided transverse incision from the midline to the medial border of the sternocleidomastoid muscle, centered over the C4 vertebral body.  I then meticulously developed the plane.  I then took down the platysma and then went forward with meticulously developing a plane between the sternocleidomastoid muscle laterally and the strap muscles medially.  The carotid artery was palpated and retracted bilaterally.  The anterior cervical spine  was readily noted.  I then placed a 20-gauge spinal needle into the C4-5 interspace, which did confirm the appropriate operative levels.  I then subperiosteally exposed the vertebral bodies of C3, C4, and C5 out to the uncovertebral joints bilaterally.  I then selected 40 mm retractor blades and a self-retaining Shadow Line retractor was placed and centered over the C4-5 interspace.  I then used a #15 blade knife and performed a preliminary diskectomy, removing the anterior two-thirds of the disk.  I then went forward with a more thorough  diskectomy and did entirely remove the intervertebral disk.  The endplates of C4 and C5 were gently prepared using a bur and a rasp.  Of note, the diskectomy was performed as posteriorly as the posterior longitudinal ligament.  I then went forward placing a series of trials and I did feel that a lordotic 7 mm trial would be the most appropriate fit.  I then packed the interbody trial with autograft obtained from removing anterior osteophytes.  Allograft bone in the form of DBX mixed from the musculoskeletal transplant foundation was also packed into the interbody device.  The interbody device was then tamped into position under distraction.  I was very happy with the final press fit.  I then turned my attention towards the C3-4 interspace.  A diskectomy was performed in the manner described previously.  At this particular level, when the posterior longitudinal ligament was encountered, I did gain access through the posterior longitudinal ligament.  I then used a #1 and then a #2 Kerrison to thoroughly remove the posterior longitudinal ligament out to both the right and the left uncovertebral joints in order to confirm that there was adequate decompression of the exiting C4 nerves bilaterally.  Of note, this was done under distraction with Caspar pins placed into the C3 and C4 vertebral bodies.  At the termination of this portion of the procedure, I was easily able to pass a nerve hook out the neural foramen on the right and the left sides at the C3-4 level.  I then went forward placing a series of trials and I did feel that an 8 mm lordotic medium tightened interbody device would be the most appropriate fit.  This was again packed with autograft and allograft as previously described.  With distraction across the Caspar pins, I did tamp an 8 mm lordotic trial to the appropriate position and again, I did note an excellent press fit.  The endplates were prepared using a rasp and a bur prior  to placement of the interbody device.  I then chose an appropriately sized Synthes Vectra plate.  The plate was bent into the appropriate amount of lordotic curvature.  The plate was then placed over the anterior cervical spine and I went forward placing a 4.5 mm x 16 mm self-drilling, self-tapping variable angle screws into the C3, C4, and C5 vertebral bodies, 2 in each vertebral body.  Of note, the patient's bone was noted to be relatively osteoporotic and I did therefore make a decision to go forward with the 4.5 mm screws in order to help optimize purchase of the vertebral bodies.  Screws were placed under both AP and lateral fluoroscopy to confirm appropriate positioning of the hardware.  At this point, all bleeding was controlled using bipolar electrocautery.  A lateral fluoroscopic view did confirm that the plate was appropriately applied, as were the interbody implants.  I then copiously irrigated the wound.  The platysma was closed using 2-0 Vicryl and the  skin was closed using 3-0 Monocryl.  Benzoin and Steri- Strips were applied followed by a sterile dressing.  All instrument counts were correct at the termination of the procedure.  Of note, Janace Litten was my assistant throughout the procedure and aided in essential retraction and suctioning required throughout the surgery.     Estill Bamberg, MD     MD/MEDQ  D:  01/16/2011  T:  01/16/2011  Job:  [id number]  cc:   Lupe Carney

## 2011-01-17 NOTE — Progress Notes (Signed)
Incision without s/s of infection and pt's pain controlled with oral pain medication.  Pt c/o difficulty swallowing as expected and pt stated that he told Dr. Yevette Edwards.  Pt able to swallow liquids and encouraged to eat soft foods at home.  Discharge instructions given.

## 2013-06-04 DIAGNOSIS — N529 Male erectile dysfunction, unspecified: Secondary | ICD-10-CM | POA: Diagnosis not present

## 2013-06-04 DIAGNOSIS — E119 Type 2 diabetes mellitus without complications: Secondary | ICD-10-CM | POA: Diagnosis not present

## 2013-06-04 DIAGNOSIS — Z299 Encounter for prophylactic measures, unspecified: Secondary | ICD-10-CM | POA: Diagnosis not present

## 2013-06-04 DIAGNOSIS — I10 Essential (primary) hypertension: Secondary | ICD-10-CM | POA: Diagnosis not present

## 2013-06-04 DIAGNOSIS — E7800 Pure hypercholesterolemia, unspecified: Secondary | ICD-10-CM | POA: Diagnosis not present

## 2013-06-04 DIAGNOSIS — L719 Rosacea, unspecified: Secondary | ICD-10-CM | POA: Diagnosis not present

## 2013-06-04 DIAGNOSIS — Z23 Encounter for immunization: Secondary | ICD-10-CM | POA: Diagnosis not present

## 2013-06-04 DIAGNOSIS — C61 Malignant neoplasm of prostate: Secondary | ICD-10-CM | POA: Diagnosis not present

## 2013-06-04 DIAGNOSIS — M25519 Pain in unspecified shoulder: Secondary | ICD-10-CM | POA: Diagnosis not present

## 2013-08-31 DIAGNOSIS — H40019 Open angle with borderline findings, low risk, unspecified eye: Secondary | ICD-10-CM | POA: Diagnosis not present

## 2013-08-31 DIAGNOSIS — E119 Type 2 diabetes mellitus without complications: Secondary | ICD-10-CM | POA: Diagnosis not present

## 2013-08-31 DIAGNOSIS — H26499 Other secondary cataract, unspecified eye: Secondary | ICD-10-CM | POA: Diagnosis not present

## 2013-08-31 DIAGNOSIS — H2510 Age-related nuclear cataract, unspecified eye: Secondary | ICD-10-CM | POA: Diagnosis not present

## 2013-09-01 DIAGNOSIS — M7500 Adhesive capsulitis of unspecified shoulder: Secondary | ICD-10-CM | POA: Diagnosis not present

## 2013-09-03 DIAGNOSIS — D219 Benign neoplasm of connective and other soft tissue, unspecified: Secondary | ICD-10-CM | POA: Diagnosis not present

## 2013-09-03 DIAGNOSIS — D239 Other benign neoplasm of skin, unspecified: Secondary | ICD-10-CM | POA: Diagnosis not present

## 2013-09-03 DIAGNOSIS — L821 Other seborrheic keratosis: Secondary | ICD-10-CM | POA: Diagnosis not present

## 2013-09-03 DIAGNOSIS — D1801 Hemangioma of skin and subcutaneous tissue: Secondary | ICD-10-CM | POA: Diagnosis not present

## 2013-09-03 DIAGNOSIS — L219 Seborrheic dermatitis, unspecified: Secondary | ICD-10-CM | POA: Diagnosis not present

## 2013-09-03 DIAGNOSIS — L570 Actinic keratosis: Secondary | ICD-10-CM | POA: Diagnosis not present

## 2013-09-03 DIAGNOSIS — L7211 Pilar cyst: Secondary | ICD-10-CM | POA: Diagnosis not present

## 2013-10-04 DIAGNOSIS — C61 Malignant neoplasm of prostate: Secondary | ICD-10-CM | POA: Diagnosis not present

## 2013-10-08 DIAGNOSIS — N400 Enlarged prostate without lower urinary tract symptoms: Secondary | ICD-10-CM | POA: Diagnosis not present

## 2013-10-08 DIAGNOSIS — C61 Malignant neoplasm of prostate: Secondary | ICD-10-CM | POA: Diagnosis not present

## 2013-10-18 DIAGNOSIS — Z23 Encounter for immunization: Secondary | ICD-10-CM | POA: Diagnosis not present

## 2013-12-07 DIAGNOSIS — E780 Pure hypercholesterolemia: Secondary | ICD-10-CM | POA: Diagnosis not present

## 2013-12-07 DIAGNOSIS — I10 Essential (primary) hypertension: Secondary | ICD-10-CM | POA: Diagnosis not present

## 2013-12-07 DIAGNOSIS — L719 Rosacea, unspecified: Secondary | ICD-10-CM | POA: Diagnosis not present

## 2013-12-07 DIAGNOSIS — E119 Type 2 diabetes mellitus without complications: Secondary | ICD-10-CM | POA: Diagnosis not present

## 2014-04-18 DIAGNOSIS — C61 Malignant neoplasm of prostate: Secondary | ICD-10-CM | POA: Diagnosis not present

## 2014-04-28 DIAGNOSIS — N400 Enlarged prostate without lower urinary tract symptoms: Secondary | ICD-10-CM | POA: Diagnosis not present

## 2014-04-28 DIAGNOSIS — C61 Malignant neoplasm of prostate: Secondary | ICD-10-CM | POA: Diagnosis not present

## 2014-06-10 DIAGNOSIS — I10 Essential (primary) hypertension: Secondary | ICD-10-CM | POA: Diagnosis not present

## 2014-06-10 DIAGNOSIS — L719 Rosacea, unspecified: Secondary | ICD-10-CM | POA: Diagnosis not present

## 2014-06-10 DIAGNOSIS — E119 Type 2 diabetes mellitus without complications: Secondary | ICD-10-CM | POA: Diagnosis not present

## 2014-06-10 DIAGNOSIS — E780 Pure hypercholesterolemia: Secondary | ICD-10-CM | POA: Diagnosis not present

## 2014-08-19 DIAGNOSIS — C44329 Squamous cell carcinoma of skin of other parts of face: Secondary | ICD-10-CM | POA: Diagnosis not present

## 2014-08-19 DIAGNOSIS — L7211 Pilar cyst: Secondary | ICD-10-CM | POA: Diagnosis not present

## 2014-09-09 DIAGNOSIS — H2512 Age-related nuclear cataract, left eye: Secondary | ICD-10-CM | POA: Diagnosis not present

## 2014-09-09 DIAGNOSIS — E119 Type 2 diabetes mellitus without complications: Secondary | ICD-10-CM | POA: Diagnosis not present

## 2014-09-09 DIAGNOSIS — H5213 Myopia, bilateral: Secondary | ICD-10-CM | POA: Diagnosis not present

## 2014-09-13 DIAGNOSIS — C44329 Squamous cell carcinoma of skin of other parts of face: Secondary | ICD-10-CM | POA: Diagnosis not present

## 2014-09-13 DIAGNOSIS — Z85828 Personal history of other malignant neoplasm of skin: Secondary | ICD-10-CM | POA: Diagnosis not present

## 2014-10-28 DIAGNOSIS — L718 Other rosacea: Secondary | ICD-10-CM | POA: Diagnosis not present

## 2014-10-28 DIAGNOSIS — C61 Malignant neoplasm of prostate: Secondary | ICD-10-CM | POA: Diagnosis not present

## 2014-10-28 DIAGNOSIS — L570 Actinic keratosis: Secondary | ICD-10-CM | POA: Diagnosis not present

## 2014-10-28 DIAGNOSIS — Z85828 Personal history of other malignant neoplasm of skin: Secondary | ICD-10-CM | POA: Diagnosis not present

## 2014-10-28 DIAGNOSIS — L821 Other seborrheic keratosis: Secondary | ICD-10-CM | POA: Diagnosis not present

## 2014-10-28 DIAGNOSIS — L218 Other seborrheic dermatitis: Secondary | ICD-10-CM | POA: Diagnosis not present

## 2014-11-04 DIAGNOSIS — C61 Malignant neoplasm of prostate: Secondary | ICD-10-CM | POA: Diagnosis not present

## 2014-12-30 DIAGNOSIS — E7800 Pure hypercholesterolemia, unspecified: Secondary | ICD-10-CM | POA: Diagnosis not present

## 2014-12-30 DIAGNOSIS — Z23 Encounter for immunization: Secondary | ICD-10-CM | POA: Diagnosis not present

## 2014-12-30 DIAGNOSIS — L719 Rosacea, unspecified: Secondary | ICD-10-CM | POA: Diagnosis not present

## 2014-12-30 DIAGNOSIS — E119 Type 2 diabetes mellitus without complications: Secondary | ICD-10-CM | POA: Diagnosis not present

## 2014-12-30 DIAGNOSIS — I10 Essential (primary) hypertension: Secondary | ICD-10-CM | POA: Diagnosis not present

## 2014-12-30 DIAGNOSIS — Z1211 Encounter for screening for malignant neoplasm of colon: Secondary | ICD-10-CM | POA: Diagnosis not present

## 2014-12-30 DIAGNOSIS — Z7984 Long term (current) use of oral hypoglycemic drugs: Secondary | ICD-10-CM | POA: Diagnosis not present

## 2015-02-13 DIAGNOSIS — Z1211 Encounter for screening for malignant neoplasm of colon: Secondary | ICD-10-CM | POA: Diagnosis not present

## 2015-02-13 DIAGNOSIS — R1310 Dysphagia, unspecified: Secondary | ICD-10-CM | POA: Diagnosis not present

## 2015-03-01 DIAGNOSIS — K210 Gastro-esophageal reflux disease with esophagitis: Secondary | ICD-10-CM | POA: Diagnosis not present

## 2015-03-01 DIAGNOSIS — K2950 Unspecified chronic gastritis without bleeding: Secondary | ICD-10-CM | POA: Diagnosis not present

## 2015-03-01 DIAGNOSIS — R1310 Dysphagia, unspecified: Secondary | ICD-10-CM | POA: Diagnosis not present

## 2015-03-01 DIAGNOSIS — Z1211 Encounter for screening for malignant neoplasm of colon: Secondary | ICD-10-CM | POA: Diagnosis not present

## 2015-03-01 DIAGNOSIS — K208 Other esophagitis: Secondary | ICD-10-CM | POA: Diagnosis not present

## 2015-03-01 DIAGNOSIS — K621 Rectal polyp: Secondary | ICD-10-CM | POA: Diagnosis not present

## 2015-07-07 DIAGNOSIS — L719 Rosacea, unspecified: Secondary | ICD-10-CM | POA: Diagnosis not present

## 2015-07-07 DIAGNOSIS — E7800 Pure hypercholesterolemia, unspecified: Secondary | ICD-10-CM | POA: Diagnosis not present

## 2015-07-07 DIAGNOSIS — E119 Type 2 diabetes mellitus without complications: Secondary | ICD-10-CM | POA: Diagnosis not present

## 2015-07-07 DIAGNOSIS — I10 Essential (primary) hypertension: Secondary | ICD-10-CM | POA: Diagnosis not present

## 2015-07-07 DIAGNOSIS — Z7984 Long term (current) use of oral hypoglycemic drugs: Secondary | ICD-10-CM | POA: Diagnosis not present

## 2015-07-28 DIAGNOSIS — C61 Malignant neoplasm of prostate: Secondary | ICD-10-CM | POA: Diagnosis not present

## 2015-08-04 DIAGNOSIS — C61 Malignant neoplasm of prostate: Secondary | ICD-10-CM | POA: Diagnosis not present

## 2015-08-04 DIAGNOSIS — N400 Enlarged prostate without lower urinary tract symptoms: Secondary | ICD-10-CM | POA: Diagnosis not present

## 2015-11-17 DIAGNOSIS — D225 Melanocytic nevi of trunk: Secondary | ICD-10-CM | POA: Diagnosis not present

## 2015-11-17 DIAGNOSIS — L7211 Pilar cyst: Secondary | ICD-10-CM | POA: Diagnosis not present

## 2015-11-17 DIAGNOSIS — D485 Neoplasm of uncertain behavior of skin: Secondary | ICD-10-CM | POA: Diagnosis not present

## 2015-11-17 DIAGNOSIS — Z85828 Personal history of other malignant neoplasm of skin: Secondary | ICD-10-CM | POA: Diagnosis not present

## 2015-11-17 DIAGNOSIS — D224 Melanocytic nevi of scalp and neck: Secondary | ICD-10-CM | POA: Diagnosis not present

## 2015-11-17 DIAGNOSIS — L821 Other seborrheic keratosis: Secondary | ICD-10-CM | POA: Diagnosis not present

## 2015-11-17 DIAGNOSIS — L218 Other seborrheic dermatitis: Secondary | ICD-10-CM | POA: Diagnosis not present

## 2015-11-17 DIAGNOSIS — D1801 Hemangioma of skin and subcutaneous tissue: Secondary | ICD-10-CM | POA: Diagnosis not present

## 2015-11-17 DIAGNOSIS — L718 Other rosacea: Secondary | ICD-10-CM | POA: Diagnosis not present

## 2015-11-17 DIAGNOSIS — D2271 Melanocytic nevi of right lower limb, including hip: Secondary | ICD-10-CM | POA: Diagnosis not present

## 2015-12-25 DIAGNOSIS — C61 Malignant neoplasm of prostate: Secondary | ICD-10-CM | POA: Diagnosis not present

## 2016-01-05 DIAGNOSIS — C61 Malignant neoplasm of prostate: Secondary | ICD-10-CM | POA: Diagnosis not present

## 2016-01-11 DIAGNOSIS — E7800 Pure hypercholesterolemia, unspecified: Secondary | ICD-10-CM | POA: Diagnosis not present

## 2016-01-11 DIAGNOSIS — E119 Type 2 diabetes mellitus without complications: Secondary | ICD-10-CM | POA: Diagnosis not present

## 2016-01-11 DIAGNOSIS — I10 Essential (primary) hypertension: Secondary | ICD-10-CM | POA: Diagnosis not present

## 2016-01-11 DIAGNOSIS — Z23 Encounter for immunization: Secondary | ICD-10-CM | POA: Diagnosis not present

## 2016-01-11 DIAGNOSIS — C61 Malignant neoplasm of prostate: Secondary | ICD-10-CM | POA: Diagnosis not present

## 2016-01-11 DIAGNOSIS — M7702 Medial epicondylitis, left elbow: Secondary | ICD-10-CM | POA: Diagnosis not present

## 2016-01-11 DIAGNOSIS — L719 Rosacea, unspecified: Secondary | ICD-10-CM | POA: Diagnosis not present

## 2016-02-19 DIAGNOSIS — C61 Malignant neoplasm of prostate: Secondary | ICD-10-CM | POA: Diagnosis not present

## 2016-02-21 ENCOUNTER — Other Ambulatory Visit: Payer: Self-pay | Admitting: Urology

## 2016-03-25 DIAGNOSIS — C61 Malignant neoplasm of prostate: Secondary | ICD-10-CM | POA: Diagnosis not present

## 2016-03-25 DIAGNOSIS — M6281 Muscle weakness (generalized): Secondary | ICD-10-CM | POA: Diagnosis not present

## 2016-04-03 DIAGNOSIS — M62838 Other muscle spasm: Secondary | ICD-10-CM | POA: Diagnosis not present

## 2016-04-03 DIAGNOSIS — M6281 Muscle weakness (generalized): Secondary | ICD-10-CM | POA: Diagnosis not present

## 2016-04-03 DIAGNOSIS — N393 Stress incontinence (female) (male): Secondary | ICD-10-CM | POA: Diagnosis not present

## 2016-04-05 NOTE — Patient Instructions (Addendum)
Mitchell Frank.  04/05/2016   Your procedure is scheduled on:  Wednesday, 04/10/16  Report to [facility] Main  Entrance take [facility]  elevators to 3rd floor to  [place] at [phone].  Call this number if you have problems the morning of surgery [phone]   Remember: ONLY 1 PERSON MAY GO WITH YOU TO SHORT STAY TO GET  READY MORNING OF [place].     Follow Dr. Zettie Pho bowel prep instructions the day before surgery on Tuesday 04/09/2016! Along with a clear liquid diet!   Drink 1 bottle of Magnesium Citrate by noon the day before surgery. Along with a Clear Liquid Diet until Midnight.        CLEAR LIQUID DIET   Foods Allowed                                                                     Foods Excluded  Coffee and tea, regular and decaf                             liquids that you cannot  Plain Jell-O in any flavor                                             see through such as: Fruit ices (not with fruit pulp)                                     milk, soups, orange juice  Iced Popsicles                                    All solid food Carbonated beverages, regular and diet                                    Cranberry, grape and apple juices Sports drinks like Gatorade Lightly seasoned clear broth or consume(fat free) Sugar, honey syrup  Sample Menu Breakfast                                Lunch                                     Supper Cranberry juice                    Beef broth                            Chicken broth Jell-O  Grape juice                           Apple juice Coffee or tea                        Jell-O                                      Popsicle                                                Coffee or tea                        Coffee or tea  _____________________________________________________________________    How to Manage Your Diabetes Before and After  Surgery  Why is it important to control my blood sugar before and after surgery? . Improving blood sugar levels before and after surgery helps healing and can limit problems. . A way of improving blood sugar control is eating a healthy diet by: o  Eating less sugar and carbohydrates o  Increasing activity/exercise o  Talking with your doctor about reaching your blood sugar goals . High blood sugars (greater than 180 mg/dL) can raise your risk of infections and slow your recovery, so you will need to focus on controlling your diabetes during the weeks before surgery. . Make sure that the doctor who takes care of your diabetes knows about your planned surgery including the date and location.  How do I manage my blood sugar before surgery? . Check your blood sugar at least 4 times a day, starting 2 days before surgery, to make sure that the level is not too high or low. o Check your blood sugar the morning of your surgery when you wake up and every 2 hours until you get to the Short Stay unit. . If your blood sugar is less than 70 mg/dL, you will need to treat for low blood sugar: o Do not take insulin. o Treat a low blood sugar (less than 70 mg/dL) with  cup of clear juice (cranberry or apple), 4 glucose tablets, OR glucose gel. o Recheck blood sugar in 15 minutes after treatment (to make sure it is greater than 70 mg/dL). If your blood sugar is not greater than 70 mg/dL on recheck, call [phone] for further instructions. . Report your blood sugar to the short stay nurse when you get to Short Stay.  . If you are admitted to the hospital after surgery: o Your blood sugar will be checked by the staff and you will probably be given insulin after surgery (instead of oral diabetes medicines) to make sure you have good blood sugar levels. o The goal for blood sugar control after surgery is 80-180 mg/dL.   WHAT DO I DO ABOUT MY DIABETES MEDICATION?        Take the day before surgery your  Metformin , the lunch dose and NO EVENING DOSE!   Marland Kitchen Do not take oral diabetes medicines (pills) the morning of surgery.     DO NOT EAT ANY FOOD OR DRINK ANY LIQUIDS AFTER MIDNIGHT!     Take these medicines the morning of surgery  with A SIP OF WATER: none                                 You may not have any metal on your body including hair pins and              piercings  Do not wear jewelry, make-up, lotions, powders or perfumes, deodorant             Do not wear nail polish.  Do not shave  48 hours prior to surgery.              Men may shave face and neck.   Do not bring valuables to the hospital. [place].  Contacts, dentures or bridgework may not be worn into surgery.  Leave suitcase in the car. After surgery it may be brought to your room.     Please read over the following fact sheets you were given: _____________________________________________________________________   [facility] - Preparing for Surgery Before surgery, you can play an important role.  Because skin is not sterile, your skin needs to be as free of germs as possible.  You can reduce the number of germs on your skin by washing with CHG (chlorahexidine gluconate) soap before surgery.  CHG is an antiseptic cleaner which kills germs and bonds with the skin to continue killing germs even after washing. Please DO NOT use if you have an allergy to CHG or antibacterial soaps.  If your skin becomes reddened/irritated stop using the CHG and inform your nurse when you arrive at Short Stay. Do not shave (including legs and underarms) for at least 48 hours prior to the first CHG shower.  You may shave your face/neck. Please follow these instructions carefully:  1.  Shower with CHG Soap the night before surgery and the  morning of Surgery.  2.  If you choose to wash your hair, wash your hair first as usual with your  normal  shampoo.  3.  After you shampoo, rinse your  hair and body thoroughly to remove the  shampoo.                           4.  Use CHG as you would any other liquid soap.  You can apply chg directly  to the skin and wash                       Gently with a scrungie or clean washcloth.  5.  Apply the CHG Soap to your body ONLY FROM THE NECK DOWN.   Do not use on face/ open                           Wound or open sores. Avoid contact with eyes, ears mouth and genitals (private parts).                       Wash face,  Genitals (private parts) with your normal soap.             6.  Wash thoroughly, paying special attention to the area where your surgery  will be performed.  7.  Thoroughly rinse your body with warm water from  the neck down.  8.  DO NOT shower/wash with your normal soap after using and rinsing off  the CHG Soap.                9.  Pat yourself dry with a clean towel.            10.  Wear clean pajamas.            11.  Place clean sheets on your bed the night of your first shower and do not  sleep with pets. Day of Surgery : Do not apply any lotions/deodorants the morning of surgery.  Please wear clean clothes to the hospital/surgery center.  FAILURE TO FOLLOW THESE INSTRUCTIONS MAY RESULT IN THE CANCELLATION OF YOUR SURGERY PATIENT SIGNATURE_________________________________  NURSE SIGNATURE__________________________________                                                                                                                                                  Incentive Spirometer  An incentive spirometer is a tool that can help keep your lungs clear and active. This tool measures how well you are filling your lungs with each breath. Taking long deep breaths may help reverse or decrease the chance of developing breathing (pulmonary) problems (especially infection) following:  A long period of time when you are unable to move or be active. BEFORE THE PROCEDURE   If the spirometer includes an indicator to show your best  effort, your nurse or respiratory therapist will set it to a desired goal.  If possible, sit up straight or lean slightly forward. Try not to slouch.  Hold the incentive spirometer in an upright position. INSTRUCTIONS FOR USE  1. Sit on the edge of your bed if possible, or sit up as far as you can in bed or on a chair. 2. Hold the incentive spirometer in an upright position. 3. Breathe out normally. 4. Place the mouthpiece in your mouth and seal your lips tightly around it. 5. Breathe in slowly and as deeply as possible, raising the piston or the ball toward the top of the column. 6. Hold your breath for 3-5 seconds or for as long as possible. Allow the piston or ball to fall to the bottom of the column. 7. Remove the mouthpiece from your mouth and breathe out normally. 8. Rest for a few seconds and repeat Steps 1 through 7 at least 10 times every 1-2 hours when you are awake. Take your time and take a few normal breaths between deep breaths. 9. The spirometer may include an indicator to show your best effort. Use the indicator as a goal to work toward during each repetition. 10. After each set of 10 deep breaths, practice coughing to be sure your lungs are clear. If you have an incision (the cut made at the time of surgery), support  your incision when coughing by placing a pillow or rolled up towels firmly against it. Once you are able to get out of bed, walk around indoors and cough well. You may stop using the incentive spirometer when instructed by your caregiver.  RISKS AND COMPLICATIONS  Take your time so you do not get dizzy or light-headed.  If you are in pain, you may need to take or ask for pain medication before doing incentive spirometry. It is harder to take a deep breath if you are having pain. AFTER USE  Rest and breathe slowly and easily.  It can be helpful to keep track of a log of your progress. Your caregiver can provide you with a simple table to help with this. If you  are using the spirometer at home, follow these instructions: [place] IF:   You are having difficultly using the spirometer.  You have trouble using the spirometer as often as instructed.  Your pain medication is not giving enough relief while using the spirometer.  You develop fever of 100.5 F (38.1 C) or higher. SEEK IMMEDIATE MEDICAL CARE IF:   You cough up bloody sputum that had not been present before.  You develop fever of 102 F (38.9 C) or greater.  You develop worsening pain at or near the incision site. MAKE SURE YOU:   Understand these instructions.  Will watch your condition.  Will get help right away if you are not doing well or get worse. Document Released: 05/20/2006 Document Revised: 04/01/2011 Document Reviewed: 07/21/2006 [employer] Patient Information 2014 [employer], [place].   ________________________________________________________________________  WHAT IS A BLOOD TRANSFUSION? Blood Transfusion Information  A transfusion is the replacement of blood or some of its parts. Blood is made up of multiple cells which provide different functions.  Red blood cells carry oxygen and are used for blood loss replacement.  White blood cells fight against infection.  Platelets control bleeding.  Plasma helps clot blood.  Other blood products are available for specialized needs, such as hemophilia or other clotting disorders. BEFORE THE TRANSFUSION  Who gives blood for transfusions?   Healthy volunteers who are fully evaluated to make sure their blood is safe. This is blood bank blood. Transfusion therapy is the safest it has ever been in the practice of medicine. Before blood is taken from a donor, a complete history is taken to make sure that person has no history of diseases nor engages in risky social behavior (examples are intravenous drug use or sexual activity with multiple partners). The donor's travel history is screened to minimize risk of  transmitting infections, such as malaria. The donated blood is tested for signs of infectious diseases, such as HIV and hepatitis. The blood is then tested to be sure it is compatible with you in order to minimize the chance of a transfusion reaction. If you or a relative donates blood, this is often done in anticipation of surgery and is not appropriate for emergency situations. It takes many days to process the donated blood. RISKS AND COMPLICATIONS Although transfusion therapy is very safe and saves many lives, the main dangers of transfusion include:   Getting an infectious disease.  Developing a transfusion reaction. This is an allergic reaction to something in the blood you were given. Every precaution is taken to prevent this. The decision to have a blood transfusion has been considered carefully by your caregiver before blood is given. Blood is not given unless the benefits outweigh the risks. AFTER THE TRANSFUSION  Right after receiving a blood transfusion, you will usually feel much better and more energetic. This is especially true if your red blood cells have gotten low (anemic). The transfusion raises the level of the red blood cells which carry oxygen, and this usually causes an energy increase.  The nurse administering the transfusion will monitor you carefully for complications. HOME CARE INSTRUCTIONS  No special instructions are needed after a transfusion. You may find your energy is better. Speak with your caregiver about any limitations on activity for underlying diseases you may have. SEEK MEDICAL CARE IF:   Your condition is not improving after your transfusion.  You develop redness or irritation at the intravenous (IV) site. SEEK IMMEDIATE MEDICAL CARE IF:  Any of the following symptoms occur over the next 12 hours:  Shaking chills.  You have a temperature by mouth above 102 F (38.9 C), not controlled by medicine.  Chest, back, or muscle pain.  People around you  feel you are not acting correctly or are confused.  Shortness of breath or difficulty breathing.  Dizziness and fainting.  You get a rash or develop hives.  You have a decrease in urine output.  Your urine turns a dark color or changes to pink, red, or brown. Any of the following symptoms occur over the next 10 days:  You have a temperature by mouth above 102 F (38.9 C), not controlled by medicine.  Shortness of breath.  Weakness after normal activity.  The white part of the eye turns yellow (jaundice).  You have a decrease in the amount of urine or are urinating less often.  Your urine turns a dark color or changes to pink, red, or brown. Document Released: 01/05/2000 Document Revised: 04/01/2011 Document Reviewed: 08/24/2007 [facility] Patient Information 2014 [place], [place].  _______________________________________________________________________

## 2016-04-08 ENCOUNTER — Encounter (INDEPENDENT_AMBULATORY_CARE_PROVIDER_SITE_OTHER): Payer: Self-pay

## 2016-04-08 ENCOUNTER — Encounter (HOSPITAL_COMMUNITY): Payer: Self-pay

## 2016-04-08 ENCOUNTER — Encounter (HOSPITAL_COMMUNITY)
Admission: RE | Admit: 2016-04-08 | Discharge: 2016-04-08 | Disposition: A | Discharge status: Home / Self Care | Payer: Medicare Other | Source: Ambulatory Visit | Attending: Urology | Admitting: Urology

## 2016-04-08 DIAGNOSIS — Z01812 Encounter for preprocedural laboratory examination: Secondary | ICD-10-CM | POA: Insufficient documentation

## 2016-04-08 DIAGNOSIS — E119 Type 2 diabetes mellitus without complications: Secondary | ICD-10-CM

## 2016-04-08 DIAGNOSIS — Z01810 Encounter for preprocedural cardiovascular examination: Secondary | ICD-10-CM

## 2016-04-08 DIAGNOSIS — I10 Essential (primary) hypertension: Secondary | ICD-10-CM

## 2016-04-08 DIAGNOSIS — Z79899 Other long term (current) drug therapy: Secondary | ICD-10-CM | POA: Diagnosis not present

## 2016-04-08 DIAGNOSIS — C61 Malignant neoplasm of prostate: Secondary | ICD-10-CM | POA: Diagnosis not present

## 2016-04-08 DIAGNOSIS — Z7984 Long term (current) use of oral hypoglycemic drugs: Secondary | ICD-10-CM | POA: Diagnosis not present

## 2016-04-08 DIAGNOSIS — Z7982 Long term (current) use of aspirin: Secondary | ICD-10-CM | POA: Diagnosis not present

## 2016-04-08 HISTORY — DX: Gastro-esophageal reflux disease without esophagitis: K21.9

## 2016-04-08 LAB — BASIC METABOLIC PANEL
Anion gap: 6 (ref 5–15)
BUN: 17 mg/dL (ref 6–20)
CHLORIDE: 103 mmol/L (ref 101–111)
CO2: 29 mmol/L (ref 22–32)
CREATININE: 1.06 mg/dL (ref 0.61–1.24)
Calcium: 9.6 mg/dL (ref 8.9–10.3)
GFR calc non Af Amer: >60 mL/min (ref >60)
Glucose, Bld: 155 mg/dL — ABNORMAL HIGH (ref 65–99)
Potassium: 5 mmol/L (ref 3.5–5.1)
Sodium: 138 mmol/L (ref 135–145)

## 2016-04-08 LAB — CBC
HEMATOCRIT: 37.1 % — AB (ref 39.0–52.0)
HEMOGLOBIN: 13.1 g/dL (ref 13.0–17.0)
MCH: 30.1 pg (ref 26.0–34.0)
MCHC: 35.3 g/dL (ref 30.0–36.0)
MCV: 85.3 fL (ref 78.0–100.0)
Platelets: 247 10*3/uL (ref 150–400)
RBC: 4.35 MIL/uL (ref 4.22–5.81)
RDW: 12.5 % (ref 11.5–15.5)
WBC: 9.4 10*3/uL (ref 4.0–10.5)

## 2016-04-08 LAB — GLUCOSE, CAPILLARY: GLUCOSE-CAPILLARY: 176 mg/dL — AB (ref 65–99)

## 2016-04-08 LAB — ABO/RH: ABO/RH(D): O POS

## 2016-04-08 NOTE — Progress Notes (Signed)
01/11/2016-Office visit from Dr. Donnie Coffin on [facility] twith labs- HGA1c, CMP, Lipid panel.

## 2016-04-10 ENCOUNTER — Encounter (HOSPITAL_COMMUNITY)
Admission: RE | Disposition: A | Discharge status: Home / Self Care | Payer: Self-pay | Source: Ambulatory Visit | Attending: Urology

## 2016-04-10 ENCOUNTER — Observation Stay (HOSPITAL_COMMUNITY)
Admission: RE | Admit: 2016-04-10 | Discharge: 2016-04-11 | Disposition: A | Discharge status: Home / Self Care | Payer: Medicare Other | Source: Ambulatory Visit | Attending: Urology | Admitting: Urology

## 2016-04-10 ENCOUNTER — Encounter (HOSPITAL_COMMUNITY): Payer: Self-pay | Admitting: *Deleted

## 2016-04-10 ENCOUNTER — Ambulatory Visit (HOSPITAL_COMMUNITY): Payer: Medicare Other | Admitting: Certified Registered Nurse Anesthetist

## 2016-04-10 DIAGNOSIS — C61 Malignant neoplasm of prostate: Principal | ICD-10-CM | POA: Insufficient documentation

## 2016-04-10 DIAGNOSIS — I10 Essential (primary) hypertension: Secondary | ICD-10-CM | POA: Diagnosis not present

## 2016-04-10 DIAGNOSIS — Z79899 Other long term (current) drug therapy: Secondary | ICD-10-CM | POA: Insufficient documentation

## 2016-04-10 DIAGNOSIS — Z7984 Long term (current) use of oral hypoglycemic drugs: Secondary | ICD-10-CM | POA: Insufficient documentation

## 2016-04-10 DIAGNOSIS — Z7982 Long term (current) use of aspirin: Secondary | ICD-10-CM | POA: Insufficient documentation

## 2016-04-10 DIAGNOSIS — Z8546 Personal history of malignant neoplasm of prostate: Secondary | ICD-10-CM | POA: Diagnosis not present

## 2016-04-10 DIAGNOSIS — E119 Type 2 diabetes mellitus without complications: Secondary | ICD-10-CM | POA: Insufficient documentation

## 2016-04-10 HISTORY — PX: LYMPHADENECTOMY: SHX5960

## 2016-04-10 HISTORY — PX: ROBOT ASSISTED LAPAROSCOPIC RADICAL PROSTATECTOMY: SHX5141

## 2016-04-10 LAB — HEMOGLOBIN A1C
Hgb A1c MFr Bld: 7.1 % — ABNORMAL HIGH (ref 4.8–5.6)
MEAN PLASMA GLUCOSE: 157 mg/dL

## 2016-04-10 LAB — GLUCOSE, CAPILLARY
GLUCOSE-CAPILLARY: 124 mg/dL — AB (ref 65–99)
GLUCOSE-CAPILLARY: 190 mg/dL — AB (ref 65–99)
GLUCOSE-CAPILLARY: 215 mg/dL — AB (ref 65–99)
Glucose-Capillary: 135 mg/dL — ABNORMAL HIGH (ref 65–99)
Glucose-Capillary: 183 mg/dL — ABNORMAL HIGH (ref 65–99)

## 2016-04-10 LAB — TYPE AND SCREEN
ABO/RH(D): O POS
Antibody Screen: NEGATIVE

## 2016-04-10 LAB — HEMOGLOBIN AND HEMATOCRIT, BLOOD
HCT: 35.3 % — ABNORMAL LOW (ref 39.0–52.0)
HEMOGLOBIN: 12.4 g/dL — AB (ref 13.0–17.0)

## 2016-04-10 SURGERY — PROSTATECTOMY, RADICAL, ROBOT-ASSISTED, LAPAROSCOPIC
Anesthesia: General

## 2016-04-10 MED ORDER — CEFAZOLIN SODIUM-DEXTROSE 2-4 GM/100ML-% IV SOLN
INTRAVENOUS | Status: AC
  Filled 2016-04-10: qty 100

## 2016-04-10 MED ORDER — HYDROMORPHONE HCL 1 MG/ML IJ SOLN
0.2500 mg | INTRAMUSCULAR | Status: DC | PRN
  Administered 2016-04-10: 0.5 mg via INTRAVENOUS

## 2016-04-10 MED ORDER — STERILE WATER FOR IRRIGATION IR SOLN
Status: DC | PRN
  Administered 2016-04-10: 1000 mL

## 2016-04-10 MED ORDER — LIDOCAINE 2% (20 MG/ML) 5 ML SYRINGE
INTRAMUSCULAR | Status: DC | PRN
  Administered 2016-04-10: 60 mg via INTRAVENOUS

## 2016-04-10 MED ORDER — HYDROCODONE-ACETAMINOPHEN 5-325 MG PO TABS: 1.0000 | ORAL_TABLET | Freq: Four times a day (QID) | ORAL | 0 refills | Status: AC | PRN

## 2016-04-10 MED ORDER — BUPIVACAINE LIPOSOME 1.3 % IJ SUSP
20.0000 mL | Freq: Once | INTRAMUSCULAR | Status: AC
  Administered 2016-04-10: 10 mL
  Filled 2016-04-10: qty 20

## 2016-04-10 MED ORDER — ONDANSETRON HCL 4 MG/2ML IJ SOLN: 4.0000 mg | Freq: Once | INTRAMUSCULAR | Status: DC | PRN

## 2016-04-10 MED ORDER — ROCURONIUM BROMIDE 10 MG/ML (PF) SYRINGE
PREFILLED_SYRINGE | INTRAVENOUS | Status: DC | PRN
  Administered 2016-04-10: 10 mg via INTRAVENOUS
  Administered 2016-04-10: 50 mg via INTRAVENOUS
  Administered 2016-04-10 (×3): 10 mg via INTRAVENOUS

## 2016-04-10 MED ORDER — AMLODIPINE BESYLATE 10 MG PO TABS
10.0000 mg | ORAL_TABLET | Freq: Every evening | ORAL | Status: DC
  Administered 2016-04-10 – 2016-04-11 (×2): 10 mg via ORAL
  Filled 2016-04-10 (×2): qty 1

## 2016-04-10 MED ORDER — CIPROFLOXACIN HCL 500 MG PO TABS: 500.0000 mg | ORAL_TABLET | Freq: Two times a day (BID) | ORAL | 0 refills | Status: AC

## 2016-04-10 MED ORDER — ROCURONIUM BROMIDE 50 MG/5ML IV SOSY
PREFILLED_SYRINGE | INTRAVENOUS | Status: AC
  Filled 2016-04-10: qty 10

## 2016-04-10 MED ORDER — ONDANSETRON HCL 4 MG/2ML IJ SOLN
4.0000 mg | INTRAMUSCULAR | Status: DC | PRN
  Administered 2016-04-10: 4 mg via INTRAVENOUS
  Filled 2016-04-10: qty 2

## 2016-04-10 MED ORDER — FENTANYL CITRATE (PF) 250 MCG/5ML IJ SOLN
INTRAMUSCULAR | Status: AC
  Filled 2016-04-10: qty 5

## 2016-04-10 MED ORDER — SODIUM CHLORIDE 0.9 % IV BOLUS (SEPSIS)
1000.0000 mL | Freq: Once | INTRAVENOUS | Status: AC
  Administered 2016-04-10: 1000 mL via INTRAVENOUS

## 2016-04-10 MED ORDER — OXYCODONE HCL 5 MG PO TABS: 5.0000 mg | ORAL_TABLET | ORAL | Status: DC | PRN

## 2016-04-10 MED ORDER — HYDROMORPHONE HCL 1 MG/ML IJ SOLN
INTRAMUSCULAR | Status: AC
  Filled 2016-04-10: qty 1

## 2016-04-10 MED ORDER — DEXTROSE-NACL 5-0.45 % IV SOLN: INTRAVENOUS | Status: DC

## 2016-04-10 MED ORDER — MIDAZOLAM HCL 2 MG/2ML IJ SOLN
INTRAMUSCULAR | Status: AC
  Filled 2016-04-10: qty 2

## 2016-04-10 MED ORDER — ONDANSETRON HCL 4 MG/2ML IJ SOLN
INTRAMUSCULAR | Status: AC
  Filled 2016-04-10: qty 2

## 2016-04-10 MED ORDER — PROPOFOL 10 MG/ML IV BOLUS
INTRAVENOUS | Status: AC
  Filled 2016-04-10: qty 40

## 2016-04-10 MED ORDER — INSULIN ASPART 100 UNIT/ML ~~LOC~~ SOLN
0.0000 [IU] | Freq: Three times a day (TID) | SUBCUTANEOUS | Status: DC
  Administered 2016-04-10: 5 [IU] via SUBCUTANEOUS
  Administered 2016-04-11: 2 [IU] via SUBCUTANEOUS
  Administered 2016-04-11: 3 [IU] via SUBCUTANEOUS
  Administered 2016-04-11: 2 [IU] via SUBCUTANEOUS

## 2016-04-10 MED ORDER — DOCUSATE SODIUM 100 MG PO CAPS
100.0000 mg | ORAL_CAPSULE | Freq: Two times a day (BID) | ORAL | Status: DC
  Administered 2016-04-10 – 2016-04-11 (×3): 100 mg via ORAL
  Filled 2016-04-10 (×3): qty 1

## 2016-04-10 MED ORDER — HYDROMORPHONE HCL 1 MG/ML IJ SOLN
0.5000 mg | INTRAMUSCULAR | Status: DC | PRN
  Administered 2016-04-10: 1 mg via INTRAVENOUS
  Filled 2016-04-10: qty 1

## 2016-04-10 MED ORDER — SODIUM CHLORIDE 0.9 % IV SOLN
INTRAVENOUS | Status: DC
  Administered 2016-04-10: 15:00:00 via INTRAVENOUS

## 2016-04-10 MED ORDER — CEFAZOLIN SODIUM-DEXTROSE 2-4 GM/100ML-% IV SOLN
2.0000 g | INTRAVENOUS | Status: AC
  Administered 2016-04-10: 2 g via INTRAVENOUS

## 2016-04-10 MED ORDER — SODIUM CHLORIDE 0.9 % IJ SOLN
INTRAMUSCULAR | Status: DC | PRN
  Administered 2016-04-10: 30 mL

## 2016-04-10 MED ORDER — MEPERIDINE HCL 50 MG/ML IJ SOLN: 6.2500 mg | INTRAMUSCULAR | Status: DC | PRN

## 2016-04-10 MED ORDER — SODIUM CHLORIDE 0.45 % IV SOLN
INTRAVENOUS | Status: DC
  Administered 2016-04-10 – 2016-04-11 (×2): via INTRAVENOUS

## 2016-04-10 MED ORDER — SENNA 8.6 MG PO TABS
1.0000 | ORAL_TABLET | Freq: Two times a day (BID) | ORAL | Status: DC
  Administered 2016-04-10 – 2016-04-11 (×3): 8.6 mg via ORAL
  Filled 2016-04-10 (×3): qty 1

## 2016-04-10 MED ORDER — ACETAMINOPHEN 325 MG PO TABS
650.0000 mg | ORAL_TABLET | Freq: Four times a day (QID) | ORAL | Status: DC
  Administered 2016-04-10 – 2016-04-11 (×4): 650 mg via ORAL
  Filled 2016-04-10 (×4): qty 2

## 2016-04-10 MED ORDER — SUGAMMADEX SODIUM 200 MG/2ML IV SOLN
INTRAVENOUS | Status: DC | PRN
  Administered 2016-04-10: 200 mg via INTRAVENOUS

## 2016-04-10 MED ORDER — DIPHENHYDRAMINE HCL 12.5 MG/5ML PO ELIX: 12.5000 mg | ORAL_SOLUTION | Freq: Four times a day (QID) | ORAL | Status: DC | PRN

## 2016-04-10 MED ORDER — LACTATED RINGERS IV SOLN
INTRAVENOUS | Status: DC
  Administered 2016-04-10 (×3): via INTRAVENOUS

## 2016-04-10 MED ORDER — LACTATED RINGERS IR SOLN
Status: DC | PRN
Start: 2016-04-10 — End: 2016-04-10
  Administered 2016-04-10: 3000 mL

## 2016-04-10 MED ORDER — PROPOFOL 10 MG/ML IV BOLUS
INTRAVENOUS | Status: DC | PRN
  Administered 2016-04-10: 120 mg via INTRAVENOUS

## 2016-04-10 MED ORDER — ONDANSETRON HCL 4 MG/2ML IJ SOLN
INTRAMUSCULAR | Status: DC | PRN
  Administered 2016-04-10: 4 mg via INTRAVENOUS

## 2016-04-10 MED ORDER — SODIUM CHLORIDE 0.9 % IJ SOLN
INTRAMUSCULAR | Status: AC
  Filled 2016-04-10: qty 20

## 2016-04-10 MED ORDER — HYDRALAZINE HCL 20 MG/ML IJ SOLN: 10.0000 mg | Freq: Four times a day (QID) | INTRAMUSCULAR | Status: DC | PRN

## 2016-04-10 MED ORDER — OXYBUTYNIN CHLORIDE 5 MG PO TABS: 5.0000 mg | ORAL_TABLET | Freq: Three times a day (TID) | ORAL | Status: DC | PRN

## 2016-04-10 MED ORDER — MIDAZOLAM HCL 5 MG/5ML IJ SOLN
INTRAMUSCULAR | Status: DC | PRN
  Administered 2016-04-10: 2 mg via INTRAVENOUS

## 2016-04-10 MED ORDER — ASPIRIN EC 81 MG PO TBEC
81.0000 mg | DELAYED_RELEASE_TABLET | Freq: Every day | ORAL | Status: DC
  Administered 2016-04-11: 81 mg via ORAL
  Filled 2016-04-10: qty 1

## 2016-04-10 MED ORDER — KETOROLAC TROMETHAMINE 15 MG/ML IJ SOLN
15.0000 mg | Freq: Four times a day (QID) | INTRAMUSCULAR | Status: DC
  Administered 2016-04-10 – 2016-04-11 (×4): 15 mg via INTRAVENOUS
  Filled 2016-04-10 (×5): qty 1

## 2016-04-10 MED ORDER — DIPHENHYDRAMINE HCL 50 MG/ML IJ SOLN: 12.5000 mg | Freq: Four times a day (QID) | INTRAMUSCULAR | Status: DC | PRN

## 2016-04-10 MED ORDER — PHENYLEPHRINE 40 MCG/ML (10ML) SYRINGE FOR IV PUSH (FOR BLOOD PRESSURE SUPPORT)
PREFILLED_SYRINGE | INTRAVENOUS | Status: DC | PRN
  Administered 2016-04-10 (×2): 80 ug via INTRAVENOUS
  Administered 2016-04-10: 40 ug via INTRAVENOUS

## 2016-04-10 MED ORDER — SUGAMMADEX SODIUM 200 MG/2ML IV SOLN
INTRAVENOUS | Status: AC
  Filled 2016-04-10: qty 2

## 2016-04-10 MED ORDER — CEFAZOLIN SODIUM-DEXTROSE 2-4 GM/100ML-% IV SOLN
2.0000 g | Freq: Three times a day (TID) | INTRAVENOUS | Status: AC
  Administered 2016-04-10 – 2016-04-11 (×2): 2 g via INTRAVENOUS
  Filled 2016-04-10 (×2): qty 100

## 2016-04-10 MED ORDER — ATORVASTATIN CALCIUM 10 MG PO TABS
20.0000 mg | ORAL_TABLET | Freq: Every evening | ORAL | Status: DC
  Administered 2016-04-10 – 2016-04-11 (×2): 20 mg via ORAL
  Filled 2016-04-10 (×2): qty 2

## 2016-04-10 MED ORDER — FENTANYL CITRATE (PF) 100 MCG/2ML IJ SOLN
INTRAMUSCULAR | Status: DC | PRN
  Administered 2016-04-10: 100 ug via INTRAVENOUS
  Administered 2016-04-10 (×3): 50 ug via INTRAVENOUS

## 2016-04-10 MED ORDER — EPHEDRINE SULFATE-NACL 50-0.9 MG/10ML-% IV SOSY
PREFILLED_SYRINGE | INTRAVENOUS | Status: DC | PRN
  Administered 2016-04-10: 10 mg via INTRAVENOUS
  Administered 2016-04-10: 5 mg via INTRAVENOUS
  Administered 2016-04-10: 10 mg via INTRAVENOUS

## 2016-04-10 SURGICAL SUPPLY — 74 items
ADH SKN CLS APL DERMABOND .7 (GAUZE/BANDAGES/DRESSINGS) ×2
APPLICATOR COTTON TIP 6IN STRL (MISCELLANEOUS) ×4 IMPLANT
CATH FOLEY 2WAY SLVR 18FR 30CC (CATHETERS) ×4 IMPLANT
CATH TIEMANN FOLEY 18FR 5CC (CATHETERS) ×4 IMPLANT
CHLORAPREP W/TINT 26ML (MISCELLANEOUS) ×4 IMPLANT
CLIP LIGATING HEM O LOK PURPLE (MISCELLANEOUS) ×12 IMPLANT
CLOTH BEACON ORANGE TIMEOUT ST (SAFETY) ×4 IMPLANT
CONT SPEC 4OZ CLIKSEAL STRL BL (MISCELLANEOUS) ×2 IMPLANT
CONT SPECI 4OZ STER CLIK (MISCELLANEOUS) ×4 IMPLANT
COVER SURGICAL LIGHT HANDLE (MISCELLANEOUS) ×4 IMPLANT
COVER TIP SHEARS 8 DVNC (MISCELLANEOUS) ×2 IMPLANT
COVER TIP SHEARS 8MM DA VINCI (MISCELLANEOUS) ×2
CUTTER ECHEON FLEX ENDO 45 340 (ENDOMECHANICALS) ×4 IMPLANT
DECANTER SPIKE VIAL GLASS SM (MISCELLANEOUS) ×4 IMPLANT
DERMABOND ADVANCED (GAUZE/BANDAGES/DRESSINGS) ×2
DERMABOND ADVANCED .7 DNX12 (GAUZE/BANDAGES/DRESSINGS) ×2 IMPLANT
DRAPE ARM DVNC X/XI (DISPOSABLE) ×8 IMPLANT
DRAPE COLUMN DVNC XI (DISPOSABLE) ×2 IMPLANT
DRAPE DA VINCI XI ARM (DISPOSABLE) ×8
DRAPE DA VINCI XI COLUMN (DISPOSABLE) ×2
DRAPE SURG IRRIG POUCH 19X23 (DRAPES) ×4 IMPLANT
DRSG TEGADERM 4X4.75 (GAUZE/BANDAGES/DRESSINGS) ×6 IMPLANT
DRSG TEGADERM 6X8 (GAUZE/BANDAGES/DRESSINGS) ×8 IMPLANT
ELECT REM PT RETURN 9FT ADLT (ELECTROSURGICAL) ×4
ELECTRODE REM PT RTRN 9FT ADLT (ELECTROSURGICAL) ×2 IMPLANT
GAUZE SPONGE 2X2 8PLY STRL LF (GAUZE/BANDAGES/DRESSINGS) ×2 IMPLANT
GLOVE BIO SURGEON STRL SZ 6.5 (GLOVE) ×3 IMPLANT
GLOVE BIO SURGEONS STRL SZ 6.5 (GLOVE) ×1
GLOVE BIOGEL M STRL SZ7.5 (GLOVE) ×8 IMPLANT
GLOVE BIOGEL PI IND STRL 7.5 (GLOVE) ×2 IMPLANT
GLOVE BIOGEL PI INDICATOR 7.5 (GLOVE) ×2
GOWN STRL REUS W/TWL LRG LVL3 (GOWN DISPOSABLE) ×8 IMPLANT
GOWN STRL REUS W/TWL LRG LVL4 (GOWN DISPOSABLE) ×12 IMPLANT
HOLDER FOLEY CATH W/STRAP (MISCELLANEOUS) ×4 IMPLANT
IRRIG SUCT STRYKERFLOW 2 WTIP (MISCELLANEOUS) ×4
IRRIGATION SUCT STRKRFLW 2 WTP (MISCELLANEOUS) ×2 IMPLANT
IV LACTATED RINGERS 1000ML (IV SOLUTION) ×4 IMPLANT
KIT PROCEDURE DA VINCI SI (MISCELLANEOUS) ×2
KIT PROCEDURE DVNC SI (MISCELLANEOUS) ×2 IMPLANT
NDL INSUFFLATION 14GA 120MM (NEEDLE) ×2 IMPLANT
NDL SPNL 22GX7 QUINCKE BK (NEEDLE) ×2 IMPLANT
NEEDLE INSUFFLATION 14GA 120MM (NEEDLE) ×4 IMPLANT
NEEDLE SPNL 22GX7 QUINCKE BK (NEEDLE) ×4 IMPLANT
PACK ROBOT UROLOGY CUSTOM (CUSTOM PROCEDURE TRAY) ×4 IMPLANT
PAD POSITIONING PINK XL (MISCELLANEOUS) IMPLANT
PORT ACCESS TROCAR AIRSEAL 12 (TROCAR) ×2 IMPLANT
PORT ACCESS TROCAR AIRSEAL 5M (TROCAR) ×2
RELOAD GREEN ECHELON 45 (STAPLE) ×4 IMPLANT
RELOAD STAPLE 45 4.1 GRN THCK (STAPLE) IMPLANT
RELOAD WH ECHELON 45 (STAPLE) ×2 IMPLANT
SEAL CANN UNIV 5-8 DVNC XI (MISCELLANEOUS) ×8 IMPLANT
SEAL XI 5MM-8MM UNIVERSAL (MISCELLANEOUS) ×8
SET TRI-LUMEN FLTR TB AIRSEAL (TUBING) ×4 IMPLANT
SHEET LAVH (DRAPES) IMPLANT
SOLUTION ELECTROLUBE (MISCELLANEOUS) ×4 IMPLANT
SPONGE GAUZE 2X2 STER 10/PKG (GAUZE/BANDAGES/DRESSINGS) ×2
SPONGE LAP 4X18 X RAY DECT (DISPOSABLE) ×4 IMPLANT
STAPLE RELOAD 45 GRN (STAPLE) ×2 IMPLANT
STAPLE RELOAD 45MM GREEN (STAPLE) ×4
SUT ETHILON 3 0 PS 1 (SUTURE) ×4 IMPLANT
SUT MNCRL AB 4-0 PS2 18 (SUTURE) ×8 IMPLANT
SUT PDS AB 1 CT1 27 (SUTURE) ×8 IMPLANT
SUT VIC AB 0 CT1 27 (SUTURE) ×4
SUT VIC AB 0 CT1 27XBRD ANTBC (SUTURE) IMPLANT
SUT VIC AB 2-0 SH 27 (SUTURE) ×4
SUT VIC AB 2-0 SH 27X BRD (SUTURE) ×2 IMPLANT
SUT VICRYL 0 UR6 27IN ABS (SUTURE) ×4 IMPLANT
SUT VLOC BARB 180 ABS3/0GR12 (SUTURE) ×12
SUTURE VLOC BRB 180 ABS3/0GR12 (SUTURE) ×6 IMPLANT
SYR 27GX1/2 1ML LL SAFETY (SYRINGE) ×4 IMPLANT
SYRINGE 1CC 27X.5 TB SAFETYGLD (MISCELLANEOUS) ×2 IMPLANT
TOWEL OR 17X26 10 PK STRL BLUE (TOWEL DISPOSABLE) ×4 IMPLANT
TOWEL OR NON WOVEN STRL DISP B (DISPOSABLE) ×4 IMPLANT
WATER STERILE IRR 1500ML POUR (IV SOLUTION) ×8 IMPLANT

## 2016-04-10 NOTE — Op Note (Signed)
NAMEHUIE, GHUMAN NO.:  [id number]  MEDICAL RECORD NO.:  [id number]  LOCATION:  8299                         FACILITY:  [facility]  PHYSICIAN:  Alexis Frock, MD     DATE OF BIRTH:  1950/04/29  DATE OF PROCEDURE: 04/10/2016                              OPERATIVE REPORT   DIAGNOSIS:  Moderate-risk prostate cancer.  PROCEDURES: 1. Robotic-assisted laparoscopic radical prostatectomy. 2. Bilateral pelvic lymphadenectomy. 3. Injection of indocyanine green dye for sentinel lymphangiography.  ESTIMATED BLOOD LOSS:  150 mL.  COMPLICATION:  None.  SPECIMENS: 1. Periprostatic fat for permanent pathology. 2. Right external iliac lymph node, sentinel. 3. Right internal iliac lymph node, sentinel. 4. Right obturator lymph nodes. 5. Left external iliac lymph node, sentinel. 6. Left obturator lymph nodes. 7. Radical prostatectomy.  FINDINGS: 1. Very large bilobar prostatic hypertrophy as anticipated. 2. Multiple sentinel lymph nodes with ICG as indicated above.  DRAINS: 1. Jackson-Pratt drain to bulb suction. 2. Foley catheter to straight drain.  ASSIST:  Debbrah Alar, PA.  INDICATION:  Mitchell Frank is a 65 year old gentleman with history of small volume low-risk prostate cancer, who on surveillance was found to have progression to higher grade disease.  He is in good health with an excellent functional status.  Options were discussed for further management including ablative therapies versus surgical extirpation versus continued surveillance and he adamantly wished to proceed with surgical therapy with prostatectomy.  Informed consent was obtained and placed in the medical record.  PROCEDURE IN DETAIL:  The patient being, Mitchell Frank, was verified. Being radical prostatectomy with sentinel lymphangiography and pelvic lymph node dissection was confirmed.  Procedure was carried out.  Time- out was performed.  Intravenous antibiotics were administered.   General endotracheal anesthesia was introduced.  The patient was placed into a low lithotomy position and sterile field was created by prepping and draping the patient's penis, perineum and proximal thighs using iodine in his infra-xiphoid abdomen using chlorhexidine gluconate.  He was further fashioned to the operating table using 3-inch tape over foam padding across his supraxiphoid chest.  His arms were tucked at his side.  A test of steep Trendelenburg positioning was performed and was found to be suitably position.  Next, a high-flow, low-pressure pneumoperitoneum was obtained using Veress technique in the infraumbilical midline having passed the aspiration and drop test. Laparoscopic examination of the peritoneal cavity revealed no significant adhesions and no visceral injury.  Additional ports were then placed as follows:  Right paramedian 8-mm robotic port, right far lateral 12-mm assistant port, right paramedian superior 5-mm suction port, left paramedian 8-mm robotic port and left far lateral 8-mm robotic port.  Robot was docked and passed through the electronic checks.  Initial attention was directed at development of space of Retzius.  Incision was made lateral to the left mid-umbilical ligament from the midline towards the area of the internal ring coursing along the iliac vessels towards the area of the ureter.  The vas deferens was encountered and purposely ligated and used as a medial bucket-handle allowing further development of the lateral plane of the bladder and the left bladder wall swept away from the pelvic sidewall towards the area of the endopelvic fascia on  the left side.  A mirror-imaged dissection was performed on the right side freeing the right bladder from the right pelvic side wall purposely ligating the right vas deferens.  Anterior attachments were taken down with cautery scissors, which exposed the anterior base of the prostate, which was defatted to  allow better demarcation of the bladder neck and prostate junction.  This was set aside and labeled as periprostatic fat.  Next, 0.2 mL of indocyanine green dye was injected into each lobe of the prostate using a robotically-guided, percutaneously-placed 18-gauge needle with intervening suction to prevent dye spillage, which did not occur.  Next, the endopelvic fascia was carefully swept away from the lateral aspect of the prostate in a base-to-apex orientation.  This exposed the dorsal venous complex, which was controlled using vascular load stapler, taking great care to avoid membranous urethral injury, which did not occur.  It had been approximately 10 minutes post dye injection and the pelvis was inspected under near-infrared fluorescence light.  Sentinel lymphangiography revealed multiple lymphatic channels coursing over the bladder and laterally towards the area of the pelvic lymph node fields.  There were multiple sentinel lymph nodes including a very prominent left external iliac very near the internal iliac area, a right internal iliac node, several right external iliac lymph nodes as well. As such, template lymphadenectomy was performed on the right side of the right external iliac lymph nodes, the confines being external iliac artery, vein, pelvic sidewall and iliac bifurcation.  Lymphostasis was achieved with cold clips.  This was set aside, labeled right external iliac lymph node, sentinel.  Similarly, there was an area of significant hyperfluorescence lymph node, also lymph node did appear to be enlarged in the internal iliac group on the right side.  This was carefully dissected free.  Lymphostasis was achieved with cold clips, labeled right internal iliac lymph node, sentinel.  Next, the right obturator group was carefully mobilized with the confines being obturator nerve, pelvic side wall, external iliac vein.  Lymphostasis was achieved with cold clips, set aside,  labeled right obturator lymph nodes.  Similarly, template lymphadenectomy was performed on the left side of left external iliac lymph nodes encompassing the aforementioned sentinel node, set aside, labeled as such and the left obturator lymph node was finally mobilized and set aside.  Bilateral obturator nerves were inspected following the maneuvers and found to be intact.  Next, the bladder neck was identified in the anterior plane by moving the Foley catheter back and forth.  Lateral release was performed in effort to try to minimize bladder neck caliber.  The patient's prostate was very very large especially wide as anticipated.  The bladder neck was then separated from the base of the prostate to anterior-posterior direction keeping what appeared to be a rim of circular muscle fibers, with this, the prostatectomy and bladder specimens.  On the right side, given the exceptionally wide aspect of the prostate, the caliber of the bladder neck was somewhat larger than desired and this was later dressed with right bladder neck reconstruction.  The posterior plane was identified by incising approximately 7 mm inferior-posterior to the posterior lip of the prostate entering the plane of Denonvilliers and dissecting inferiorly.  The bilateral vas deferens were encountered, dissected for distance approximately 4 cm, ligated, and placed on gentle superior traction.  Bilateral seminal vesicles were dissected to their tips, also placed on gentle superior traction.  This posterior dissection was continued within the plane of Denonvilliers towards the area of the apex  of the prostate.  This exposed the vascular pedicles on each side. Given the patient's predominantly right-sided disease, a purposefully wide dissection was performed on the right of the vascular pedicles using a sequential clipping technique from the base to the apex of the prostate.  On the left side, a sequential clipping technique  was also used by performing a slightly more prostate centric dissection thus performing limited nerve-sparing on the left with none on the right. Apical dissection was then performed in the anterior plane, placing the prostate in gentle superior traction with the Foley catheter in situ. The membranous urethra was purposely transected, this completely freed up the radical prostatectomy specimen, which was placed into an EndoCatch bag for later retrieval.  Next, digital rectal exam was performed using indicator glove under laparoscopic vision and no evidence of rectal violation was noted.  Posterior reconstruction was performed using a 3-0 V-Loc suture reapproximating the posterior urethral plate to the posterior bladder neck bringing these structures in the tension-free apposition.  Again, the right-sided bladder neck aspect was somewhat wider than desired and a figure-of-eight Vicryl was used in an imbricating fashion to fold the bladder neck mucosa inwards away from the right ureter, this driving it away from the area of the bladder neck informing a caliber of the bladder neck that was approximately 1-1.5 cm in diameter.  Next, mucosa-to-mucosa anastomosis was performed between the bladder neck and membranous urethra using double-armed V-Loc suture from the 6 o'clock to the 12 o'clock position, which resulted in excellent mucosal apposition.  A new Foley catheter was placed per urethra, which irrigated quantitatively without visible leak laparoscopically.  A closed suction drain was brought through the previous left lateral most robotic port site near the peritoneal cavity. Robot was then undocked.  The previous right lateral most assistant port site was closed at the level of the fascia using Carter-Thomason suture passer and 0 Vicryl.  Specimen was retrieved by extending the previous camera port site inferiorly for total distance of approximately 4 cm, removing the very large radical  prostatectomy specimen and setting it aside for permanent pathology.  Extraction site was closed at the level of the fascia using figure-of-eight PDS x3 with reapproximation of Scarpa's using running Vicryl.  All incision sites were infiltrated with dilute lyophilized Marcaine and closed at the level of the skin using subcuticular Monocryl followed by Dermabond.  Drain stitch was applied and procedure was terminated.  The patient tolerated the procedure well. There were no immediate periprocedural complications.  The patient was taken to the postanesthesia care unit in stable condition.  Please note physician assistant, Debbrah Alar, was absolutely crucial for the procedure today.  She provided invaluable retraction, suctioning, clip application, stapler application without which this would not be possible today.          ______________________________ Alexis Frock, MD     TM/MEDQ  D:  04/10/2016  T:  04/10/2016  Job:  [id number]

## 2016-04-10 NOTE — Brief Op Note (Signed)
04/10/2016  5:16 PM  PATIENT:  Mitchell Frank.  66 y.o. male  PRE-OPERATIVE DIAGNOSIS:  MODERATE RISK PROSTATE CANCER  POST-OPERATIVE DIAGNOSIS:  MODERATE RISK PROSTATE CANCER  PROCEDURE:  Procedure(s): XI ROBOTIC ASSISTED LAPAROSCOPIC RADICAL PROSTATECTOMY WITH INDOCYANINE GREEN DYE (N/A) LYMPHADENECTOMY (Bilateral)  SURGEON:  Surgeon(s) and Role:    * Alexis Frock, MD - Primary  PHYSICIAN ASSISTANT:   ASSISTANTS: Clemetine Marker PA   ANESTHESIA:   local and general  EBL:  Total I/O In: 2300 [P.O.:200; I.V.:1100; IV Piggyback:1000] Out: 440 [Urine:200; Drains:140; Blood:100]  BLOOD ADMINISTERED:none  DRAINS: 1 - JP to bulb, 2 - Foley to gravity   LOCAL MEDICATIONS USED:  MARCAINE     SPECIMEN:  Source of Specimen:  1 - prostatectomy, 2 - pelvic lymph nodes  DISPOSITION OF SPECIMEN:  PATHOLOGY  COUNTS:  YES  TOURNIQUET:  * No tourniquets in log *  DICTATION: .Other Dictation: Dictation Number [id number]  PLAN OF CARE: Admit for overnight observation  PATIENT DISPOSITION:  PACU - hemodynamically stable.   Delay start of Pharmacological VTE agent (>24hrs) due to surgical blood loss or risk of bleeding: yes

## 2016-04-10 NOTE — Progress Notes (Signed)
Hgb. And Hct. Drawn by lab. 

## 2016-04-10 NOTE — Anesthesia Postprocedure Evaluation (Signed)
Anesthesia Post Note  Patient: Haley Roza.  Procedure(s) Performed: Procedure(s) (LRB): XI ROBOTIC ASSISTED LAPAROSCOPIC RADICAL PROSTATECTOMY WITH INDOCYANINE GREEN DYE (N/A) LYMPHADENECTOMY (Bilateral)  Patient location during evaluation: PACU Anesthesia Type: General Level of consciousness: awake and alert Pain management: pain level controlled Vital Signs Assessment: post-procedure vital signs reviewed and stable Respiratory status: spontaneous breathing, nonlabored ventilation, respiratory function stable and patient connected to nasal cannula oxygen Cardiovascular status: blood pressure returned to baseline and stable Postop Assessment: no signs of nausea or vomiting Anesthetic complications: no       Last Vitals:  Vitals:   04/10/16 1330 04/10/16 1359  BP: (!) 159/73 (!) 154/71  Pulse: 81 77  Resp: 16 16  Temp: 36.4 C 36.4 C    Last Pain:  Vitals:   04/10/16 1330  TempSrc:   PainSc: 4                  Rohith Fauth DAVID

## 2016-04-10 NOTE — Anesthesia Procedure Notes (Signed)
Procedure Name: Intubation Performed by: Gean Maidens Pre-anesthesia Checklist: Patient identified, Emergency Drugs available, Suction available, Patient being monitored and Timeout performed Patient Re-evaluated:Patient Re-evaluated prior to inductionOxygen Delivery Method: Circle system utilized Preoxygenation: Pre-oxygenation with 100% oxygen Intubation Type: IV induction Ventilation: Mask ventilation without difficulty Laryngoscope Size: Mac and 4 Grade View: Grade I Tube type: Oral Tube size: 7.5 mm Number of attempts: 1 Airway Equipment and Method: Stylet Placement Confirmation: ETT inserted through vocal cords under direct vision,  positive ETCO2,  CO2 detector and breath sounds checked- equal and bilateral Secured at: 23 cm Tube secured with: Tape Dental Injury: Teeth and Oropharynx as per pre-operative assessment

## 2016-04-10 NOTE — Discharge Instructions (Signed)

## 2016-04-10 NOTE — Discharge Summary (Signed)
Physician Discharge Summary  Patient ID: Mitchell Frank. MRN: [id number] DOB/AGE: [birth year] 68 y.o.  Admit date: 04/10/2016 Discharge date: 04/10/2016  Admission Diagnoses: Prostate cancer  Discharge Diagnoses: Prostate cancer  Discharged Condition: good  Hospital Course:  [name]. is a 66 y.o. male who is s/p RAL prostatectomy and bilateral pelvic lymph node dissection with sentinel LND on 04/10/16 with Dr. Tresa Moore for prostate cancer.  The patient tolerated the procedure well, was extubated in the OR and taken to the recovery unit for routine postoperative care. They were then transferred to the floor. By POD 1 afternoon he had met the usual goals for discharge including ambulating at a preoperative capacity, having pain controlled with PO PRN medications, and tolerating a regular diet. JP removed prior to discharge as output scant and Cr same as serum.  The patient will follow up with [facility] on 4/3. They will be discharged with prescriptions for vicodin & cipro for pericatheter removal antibiotics.    Consults: None  Treatments: surgery: as noted above  Discharge Exam: Blood pressure (!) 145/71, pulse 76, temperature 98 F (36.7 C), temperature source Oral, resp. rate 16, height 5' 11.5" (1.816 m), weight 76.7 kg (169 lb 3 oz), SpO2 99 %.  General:  well-developed and well-nourished male in NAD, lying in bed, alert & oriented, pleasant HEENT: [place]/AT, EOMI, sclera anicteric, hearing grossly intact, no nasal discharge, MMM Respiratory: nonlabored respirations, satting well on RA, symmetrical chest rise Cardiovascular: pulse regular rate & rhythm Abdominal: soft, NTTP, nondistended, surgical incisions c/d/i without signs of exudate/erythema GU: Foley catheter draining medium yellow urine Extremities: warm, well-perfused, no c/c/e Neuro: no focal deficits   Disposition: 01-Home or Self Care   Allergies as of 04/10/2016      Reactions   Sulfa  Antibiotics Other (See Comments)   BLADDER ISSUES      Medication List    STOP taking these medications   aspirin EC 81 MG tablet   Fish Oil 1200 MG Caps   multivitamin with minerals tablet     TAKE these medications   amLODipine 10 MG tablet Commonly known as:  NORVASC Take 10 mg by mouth every evening.   atorvastatin 20 MG tablet Commonly known as:  LIPITOR Take 20 mg by mouth every evening.   ciprofloxacin 500 MG tablet Commonly known as:  CIPRO Take 1 tablet (500 mg total) by mouth 2 (two) times daily. Start day prior to office visit for foley removal   HYDROcodone-acetaminophen 5-325 MG tablet Commonly known as:  NORCO Take 1-2 tablets by mouth every 6 (six) hours as needed for moderate pain or severe pain.   lisinopril 10 MG tablet Commonly known as:  PRINIVIL,ZESTRIL Take 10 mg by mouth daily.   metFORMIN 500 MG tablet Commonly known as:  GLUCOPHAGE Take 500-1,000 mg by mouth as directed. 1TAB AT LUNCH 2 TABs IN EVENING   metroNIDAZOLE 0.75 % gel Commonly known as:  METROGEL Apply 1 application topically 2 (two) times daily as needed. FOR ROSACEA OUTBREAK (FACE)      Follow-up Information    Alexis Frock, MD On 04/23/2016.   Specialty:  Urology Why:  at 48 AM for MD visit.  Contact information: [place] [id number] [phone]           Signed: Burnice Logan 04/10/2016, 8:50 AM

## 2016-04-10 NOTE — Anesthesia Preprocedure Evaluation (Signed)
Anesthesia Evaluation  Patient identified by MRN, date of birth, ID band Patient awake    Reviewed: Allergy & Precautions, NPO status , Patient's Chart, lab work & pertinent test results  Airway Mallampati: I  TM Distance: >3 FB Neck ROM: Full    Dental   Pulmonary    Pulmonary exam normal        Cardiovascular hypertension, Pt. on medications Normal cardiovascular exam     Neuro/Psych    GI/Hepatic GERD  Medicated and Controlled,  Endo/Other  diabetes, Type 2, Oral Hypoglycemic Agents  Renal/GU      Musculoskeletal   Abdominal   Peds  Hematology   Anesthesia Other Findings   Reproductive/Obstetrics                             Anesthesia Physical Anesthesia Plan  ASA: II  Anesthesia Plan: General   Post-op Pain Management:    Induction: Intravenous  Airway Management Planned: Oral ETT  Additional Equipment:   Intra-op Plan:   Post-operative Plan: Extubation in OR  Informed Consent: I have reviewed the patients History and Physical, chart, labs and discussed the procedure including the risks, benefits and alternatives for the proposed anesthesia with the patient or authorized representative who has indicated his/her understanding and acceptance.     Plan Discussed with: CRNA and Surgeon  Anesthesia Plan Comments:         Anesthesia Quick Evaluation

## 2016-04-10 NOTE — H&P (Signed)
Mitchell Frank. is an 66 y.o. male.    Chief Complaint: Pre-op Robotic Prostatectomy   HPI:   1 - Moderate Risk Prostate Cancer - Initial DX very low risk disesae 2014. Progression to moderate risk by biopsy 12/2015 with Gleason 7 disease RLB (60%) and Gleason 6 LMA, RMA, RMM, RML TRUS 19mL. He is bothored by progressive obstructive symptoms with urgency and nocturia x 4-5.   PMH sig for Dm2 (well controlled, no neuropathy), ortho surgery. NO ischemic CV disease / blood thinners. His PCP is Donnie Coffin MD.   Today " Mitchell Frank " is seen to proceed with robotic prostatectomy with lymphadenectomy.   Past Medical History:  Diagnosis Date  . Arthritis   . Diabetes mellitus   . GERD (gastroesophageal reflux disease)   . Hypertension     Past Surgical History:  Procedure Laterality Date  . ANTERIOR CERVICAL DECOMP/DISCECTOMY FUSION  01/16/2011   Procedure: ANTERIOR CERVICAL DECOMPRESSION/DISCECTOMY FUSION 2 LEVELS;  Surgeon: Lawrence Santiago [facility];  Location: [place];  Service: Orthopedics;  Laterality: Left;  Anterior cervical decompression fusion, cervical 3-4, cervical 4-5 with instrumentation and allograft.  Marland Kitchen CATARACT EXTRACTION     right  . KNEE ARTHROSCOPY     right  . KNEE CARTILAGE SURGERY     left knee    No family history on file. Social History:  reports that he has never smoked. He has never used smokeless tobacco. He reports that he drinks alcohol. He reports that he does not use drugs.  Allergies:  Allergies  Allergen Reactions  . Sulfa Antibiotics Other (See Comments)    BLADDER ISSUES    Medications Prior to Admission  Medication Sig Dispense Refill  . amLODipine (NORVASC) 10 MG tablet Take 10 mg by mouth every evening.     Marland Kitchen aspirin EC 81 MG tablet Take 81 mg by mouth daily.    Marland Kitchen atorvastatin (LIPITOR) 20 MG tablet Take 20 mg by mouth every evening.    Marland Kitchen lisinopril (PRINIVIL,ZESTRIL) 10 MG tablet Take 10 mg by mouth daily.      . metFORMIN (GLUCOPHAGE)  500 MG tablet Take 500-1,000 mg by mouth as directed. 1TAB AT LUNCH 2 TABs IN EVENING    . metroNIDAZOLE (METROGEL) 0.75 % gel Apply 1 application topically 2 (two) times daily as needed. FOR ROSACEA OUTBREAK ([place])     . Multiple Vitamins-Minerals (MULTIVITAMIN WITH MINERALS) tablet Take 1 tablet by mouth daily.    . Omega-3 Fatty Acids (FISH OIL) 1200 MG CAPS Take 2 capsules by mouth daily.      Results for orders placed or performed during the hospital encounter of 04/10/16 (from the past 48 hour(s))  Glucose, capillary     Status: Abnormal   Collection Time: 04/10/16  6:26 AM  Result Value Ref Range   Glucose-Capillary 124 (H) 65 - 99 mg/dL   Comment 1 Notify RN    No results found.  Review of Systems  Constitutional: Negative.  Negative for chills and fever.  HENT: Negative.   Eyes: Negative.   Respiratory: Negative.   Cardiovascular: Negative.   Gastrointestinal: Negative.   Genitourinary: Negative.   Musculoskeletal: Negative.   Skin: Negative.   Neurological: Negative.   Endo/Heme/Allergies: Negative.   Psychiatric/Behavioral: Negative.     There were no vitals taken for this visit. Physical Exam  Constitutional: He appears well-developed.  HENT:  Head: Normocephalic.  Eyes: Pupils are equal, round, and reactive to light.  Neck: Normal range of motion.  Cardiovascular: Normal  rate.   Respiratory: Effort normal.  GI: Soft.  Genitourinary:  Genitourinary Comments: No CVAT  Musculoskeletal: Normal range of motion.  Neurological: He is alert.  Skin: Skin is warm.  Psychiatric: He has a normal mood and affect.     Assessment/Plan  1 - Moderate Risk Prostate Cancer - proceed as planned with robotic prostatectomy with lymphadenectomy today. Risks, benefits, alternatives, expected peri-op course discussed previously and reiterated today.   Alexis Frock, MD 04/10/2016, 6:31 AM

## 2016-04-10 NOTE — Progress Notes (Signed)
Hgb. 12.4- Hct. 35.3- results noted

## 2016-04-10 NOTE — Transfer of Care (Signed)
Immediate Anesthesia Transfer of Care Note  Patient: Chon Buhl.  Procedure(s) Performed: Procedure(s): XI ROBOTIC ASSISTED LAPAROSCOPIC RADICAL PROSTATECTOMY WITH INDOCYANINE GREEN DYE (N/A) LYMPHADENECTOMY (Bilateral)  Patient Location: PACU  Anesthesia Type:General  Level of Consciousness: sedated, patient cooperative and responds to stimulation  Airway & Oxygen Therapy: Patient Spontanous Breathing and Patient connected to face mask oxygen  Post-op Assessment: Report given to RN and Post -op Vital signs reviewed and stable  Post vital signs: Reviewed and stable  Last Vitals:  Vitals:   04/10/16 0649  BP: (!) 145/71  Pulse: 76  Resp: 16  Temp: 36.7 C    Last Pain:  Vitals:   04/10/16 0649  TempSrc: Oral         Complications: No apparent anesthesia complications

## 2016-04-11 ENCOUNTER — Encounter (HOSPITAL_COMMUNITY): Payer: Self-pay | Admitting: Urology

## 2016-04-11 DIAGNOSIS — C61 Malignant neoplasm of prostate: Secondary | ICD-10-CM | POA: Diagnosis not present

## 2016-04-11 DIAGNOSIS — I10 Essential (primary) hypertension: Secondary | ICD-10-CM | POA: Diagnosis not present

## 2016-04-11 DIAGNOSIS — Z7984 Long term (current) use of oral hypoglycemic drugs: Secondary | ICD-10-CM | POA: Diagnosis not present

## 2016-04-11 DIAGNOSIS — Z7982 Long term (current) use of aspirin: Secondary | ICD-10-CM | POA: Diagnosis not present

## 2016-04-11 DIAGNOSIS — E119 Type 2 diabetes mellitus without complications: Secondary | ICD-10-CM | POA: Diagnosis not present

## 2016-04-11 DIAGNOSIS — Z79899 Other long term (current) drug therapy: Secondary | ICD-10-CM | POA: Diagnosis not present

## 2016-04-11 LAB — HEMOGLOBIN AND HEMATOCRIT, BLOOD
HEMATOCRIT: 31.6 % — AB (ref 39.0–52.0)
HEMOGLOBIN: 10.9 g/dL — AB (ref 13.0–17.0)

## 2016-04-11 LAB — CREATININE, FLUID (PLEURAL, PERITONEAL, JP DRAINAGE): CREAT FL: 1 mg/dL

## 2016-04-11 LAB — GLUCOSE, CAPILLARY
GLUCOSE-CAPILLARY: 146 mg/dL — AB (ref 65–99)
GLUCOSE-CAPILLARY: 179 mg/dL — AB (ref 65–99)
Glucose-Capillary: 124 mg/dL — ABNORMAL HIGH (ref 65–99)

## 2016-04-11 MED ORDER — SENNA 8.6 MG PO TABS: 1.0000 | ORAL_TABLET | Freq: Two times a day (BID) | ORAL | 0 refills | Status: AC

## 2016-04-11 NOTE — Progress Notes (Signed)
UROLOGY PROGRESS NOTES  Assessment/Plan: Mitchell Musson. is a 66 y.o. male with a history of OA, DM, GERD, HTN who is s/p RALP and sentinel bilateral PLND on 04/10/16 with Dr. Tresa Moore.  Interval/Plan: AFVSS. Adequate UOP. 350cc total from JP drain, JP Cr 1.0. Hb 10.9 from 12.4. NAEON.  - Wean oxygen - JP drain for Cr - OOB/ambulate/IS - Regular diet - Saline lock - Hopeful discharge later today   Subjective: Had emesis x1 yesterday postop, nausea resolved, none this AM. NAEON. Pain controlled. Ambulated. No flatus or BM yet. Tolerated clears.  Objective:  Vital signs in last 24 hours: Temp:  [97.5 F (36.4 C)-98.3 F (36.8 C)] 98.3 F (36.8 C) (03/22 0527) Pulse Rate:  [72-81] 73 (03/22 0527) Resp:  [12-17] 16 (03/22 0527) BP: (127-172)/(62-81) 133/62 (03/22 0527) SpO2:  [93 %-100 %] 99 % (03/22 0527)  03/21 0701 - 03/22 0700 In: 3430.4 [P.O.:320; I.V.:2010.4; IV Piggyback:1100] Out: 2550 [Urine:2100; Drains:350; Blood:100]    Physical Exam:  General:  well-developed and well-nourished male in NAD, lying in bed, alert & oriented, pleasant HEENT: [place]/AT, EOMI, sclera anicteric, hearing grossly intact, no nasal discharge, MMM Respiratory: nonlabored respirations, satting well on RA, symmetrical chest rise Cardiovascular: pulse regular rate & rhythm Abdominal: soft, NTTP, nondistended, surgical incisions c/d/i without signs of exudate/erythema GU: Foley catheter draining yellow clear urine, JP with SS drainage Extremities: warm, well-perfused, no c/c/e Neuro: no focal deficits   Data Review: Results for orders placed or performed during the hospital encounter of 04/10/16 (from the past 24 hour(s))  Glucose, capillary     Status: Abnormal   Collection Time: 04/10/16 12:48 PM  Result Value Ref Range   Glucose-Capillary 135 (H) 65 - 99 mg/dL   Comment 1 Notify RN    Comment 2 Document in Chart   Hemoglobin and hematocrit, blood     Status: Abnormal   Collection  Time: 04/10/16 12:54 PM  Result Value Ref Range   Hemoglobin 12.4 (L) 13.0 - 17.0 g/dL   HCT 35.3 (L) 39.0 - 52.0 %  Glucose, capillary     Status: Abnormal   Collection Time: 04/10/16  2:05 PM  Result Value Ref Range   Glucose-Capillary 190 (H) 65 - 99 mg/dL  Glucose, capillary     Status: Abnormal   Collection Time: 04/10/16  4:43 PM  Result Value Ref Range   Glucose-Capillary 215 (H) 65 - 99 mg/dL  Glucose, capillary     Status: Abnormal   Collection Time: 04/10/16 10:09 PM  Result Value Ref Range   Glucose-Capillary 183 (H) 65 - 99 mg/dL   Comment 1 Notify RN   Hemoglobin and hematocrit, blood     Status: Abnormal   Collection Time: 04/11/16  6:11 AM  Result Value Ref Range   Hemoglobin 10.9 (L) 13.0 - 17.0 g/dL   HCT 31.6 (L) 39.0 - 52.0 %  Glucose, capillary     Status: Abnormal   Collection Time: 04/11/16  7:42 AM  Result Value Ref Range   Glucose-Capillary 146 (H) 65 - 99 mg/dL    Imaging: None

## 2016-04-18 ENCOUNTER — Encounter (HOSPITAL_COMMUNITY): Payer: Self-pay | Admitting: Emergency Medicine

## 2016-04-18 ENCOUNTER — Emergency Department (HOSPITAL_COMMUNITY)
Admission: EM | Admit: 2016-04-18 | Discharge: 2016-04-18 | Disposition: A | Discharge status: Home / Self Care | Payer: Medicare Other | Attending: Emergency Medicine | Admitting: Emergency Medicine

## 2016-04-18 DIAGNOSIS — E119 Type 2 diabetes mellitus without complications: Secondary | ICD-10-CM | POA: Insufficient documentation

## 2016-04-18 DIAGNOSIS — R310 Gross hematuria: Secondary | ICD-10-CM | POA: Insufficient documentation

## 2016-04-18 DIAGNOSIS — Z7984 Long term (current) use of oral hypoglycemic drugs: Secondary | ICD-10-CM | POA: Diagnosis not present

## 2016-04-18 DIAGNOSIS — Y638 Failure in dosage during other surgical and medical care: Secondary | ICD-10-CM | POA: Insufficient documentation

## 2016-04-18 DIAGNOSIS — Z8546 Personal history of malignant neoplasm of prostate: Secondary | ICD-10-CM | POA: Diagnosis not present

## 2016-04-18 DIAGNOSIS — T83098A Other mechanical complication of other indwelling urethral catheter, initial encounter: Secondary | ICD-10-CM | POA: Insufficient documentation

## 2016-04-18 DIAGNOSIS — T839XXA Unspecified complication of genitourinary prosthetic device, implant and graft, initial encounter: Secondary | ICD-10-CM

## 2016-04-18 DIAGNOSIS — R339 Retention of urine, unspecified: Secondary | ICD-10-CM | POA: Insufficient documentation

## 2016-04-18 DIAGNOSIS — I10 Essential (primary) hypertension: Secondary | ICD-10-CM | POA: Insufficient documentation

## 2016-04-18 LAB — CBC WITH DIFFERENTIAL/PLATELET
Basophils Absolute: 0 10*3/uL (ref 0.0–0.1)
Basophils Relative: 0 %
EOS ABS: 0 10*3/uL (ref 0.0–0.7)
Eosinophils Relative: 0 %
HEMATOCRIT: 31.3 % — AB (ref 39.0–52.0)
HEMOGLOBIN: 11.1 g/dL — AB (ref 13.0–17.0)
LYMPHS ABS: 1.4 10*3/uL (ref 0.7–4.0)
Lymphocytes Relative: 10 %
MCH: 30.7 pg (ref 26.0–34.0)
MCHC: 35.5 g/dL (ref 30.0–36.0)
MCV: 86.7 fL (ref 78.0–100.0)
MONOS PCT: 8 %
Monocytes Absolute: 1 10*3/uL (ref 0.1–1.0)
NEUTROS ABS: 11.3 10*3/uL — AB (ref 1.7–7.7)
Neutrophils Relative %: 82 %
Platelets: 326 10*3/uL (ref 150–400)
RBC: 3.61 MIL/uL — ABNORMAL LOW (ref 4.22–5.81)
RDW: 12.4 % (ref 11.5–15.5)
WBC: 13.8 10*3/uL — ABNORMAL HIGH (ref 4.0–10.5)

## 2016-04-18 LAB — URINALYSIS, ROUTINE W REFLEX MICROSCOPIC

## 2016-04-18 LAB — BASIC METABOLIC PANEL
ANION GAP: 9 (ref 5–15)
BUN: 16 mg/dL (ref 6–20)
CALCIUM: 8.9 mg/dL (ref 8.9–10.3)
CO2: 25 mmol/L (ref 22–32)
Chloride: 98 mmol/L — ABNORMAL LOW (ref 101–111)
Creatinine, Ser: 1.23 mg/dL (ref 0.61–1.24)
GFR, EST NON AFRICAN AMERICAN: 59 mL/min — AB (ref >60)
Glucose, Bld: 138 mg/dL — ABNORMAL HIGH (ref 65–99)
POTASSIUM: 4.6 mmol/L (ref 3.5–5.1)
SODIUM: 132 mmol/L — AB (ref 135–145)

## 2016-04-18 LAB — URINALYSIS, MICROSCOPIC (REFLEX): Squamous Epithelial / LPF: NONE SEEN

## 2016-04-18 NOTE — ED Provider Notes (Signed)
[facility] DEPT Provider Note   CSN: [id number] Arrival date & time: 04/18/16  1700     History   Chief Complaint Chief Complaint  Patient presents with  . Foley Catheter Problem    HPI Mitchell Frank. is a 66 y.o. male.  HPI  66 year old male who With hematuria through Foley catheter. He is status post laparoscopic radical prostatectomy by Dr. Tresa Moore on 04/10/2016. Was discharged from the hospital with Foley catheter. States that he strained to have a bowel movement today, and subsequently noticed blood through the catheter and around the catheter through the urethra. Initially was draining urine without difficulty, and the urology office just recommended observing for now. Around 3-4 PM he began to notice diminished urine output. He began to have bladder fullness, pressure in the low abdomen, and intermittent low back pain. No nausea or vomiting, fevers or chills. It was recommended that he come to the ED for evaluation by the urology office. Did not try to flush foley. No blood thinners.  Past Medical History:  Diagnosis Date  . Arthritis   . Diabetes mellitus   . GERD (gastroesophageal reflux disease)   . Hypertension     Patient Active Problem List   Diagnosis Date Noted  . Prostate cancer ([place]) 04/10/2016    Past Surgical History:  Procedure Laterality Date  . ANTERIOR CERVICAL DECOMP/DISCECTOMY FUSION  01/16/2011   Procedure: ANTERIOR CERVICAL DECOMPRESSION/DISCECTOMY FUSION 2 LEVELS;  Surgeon: Lawrence Santiago [facility];  Location: [facility];  Service: Orthopedics;  Laterality: Left;  Anterior cervical decompression fusion, cervical 3-4, cervical 4-5 with instrumentation and allograft.  Marland Kitchen CATARACT EXTRACTION     right  . KNEE ARTHROSCOPY     right  . KNEE CARTILAGE SURGERY     left knee  . LYMPHADENECTOMY Bilateral 04/10/2016   Procedure: LYMPHADENECTOMY;  Surgeon: Alexis Frock, MD;  Location: [facility];  Service: Urology;  Laterality: Bilateral;  . ROBOT ASSISTED  LAPAROSCOPIC RADICAL PROSTATECTOMY N/A 04/10/2016   Procedure: XI ROBOTIC ASSISTED LAPAROSCOPIC RADICAL PROSTATECTOMY WITH INDOCYANINE GREEN DYE;  Surgeon: Alexis Frock, MD;  Location: [facility];  Service: Urology;  Laterality: N/A;       Home Medications    Prior to Admission medications   Medication Sig Start Date End Date Taking? Authorizing Provider  amLODipine (NORVASC) 10 MG tablet Take 10 mg by mouth every evening.    Yes Historical Provider, MD  atorvastatin (LIPITOR) 20 MG tablet Take 20 mg by mouth every evening.   Yes Historical Provider, MD  HYDROcodone-acetaminophen (NORCO) 5-325 MG tablet Take 1-2 tablets by mouth every 6 (six) hours as needed for moderate pain or severe pain. 04/10/16  Yes Amanda Dancy, PA-C  lisinopril (PRINIVIL,ZESTRIL) 10 MG tablet Take 10 mg by mouth every evening.    Yes Historical Provider, MD  metFORMIN (GLUCOPHAGE) 500 MG tablet Take 500-1,000 mg by mouth as directed. 1TAB AT LUNCH 2 TABs IN EVENING   Yes Historical Provider, MD  senna (SENOKOT) 8.6 MG TABS tablet Take 1 tablet (8.6 mg total) by mouth 2 (two) times daily. While taking strong pain meds to prevent constipation. Patient taking differently: Take 1 tablet by mouth daily as needed for mild constipation. While taking strong pain meds to prevent constipation. 04/11/16  Yes Alexis Frock, MD  ciprofloxacin (CIPRO) 500 MG tablet Take 1 tablet (500 mg total) by mouth 2 (two) times daily. Start day prior to office visit for foley removal Patient not taking: Reported on 04/18/2016 04/10/16   Estill Bamberg  Dancy, PA-C  metroNIDAZOLE (METROGEL) 0.75 % gel Apply 1 application topically 2 (two) times daily as needed. FOR ROSACEA OUTBREAK (FACE)     Historical Provider, MD    Family History History reviewed. No pertinent family history.  Social History Social History  Substance Use Topics  . Smoking status: Never Smoker  . Smokeless tobacco: Never Used  . Alcohol use Yes     Comment: occassionally      Allergies   Sulfa antibiotics   Review of Systems Review of Systems 10/14 systems reviewed and are negative other than those stated in the HPI   Physical Exam Updated Vital Signs BP 139/70 (BP Location: Left Arm)   Pulse 79   Temp 98.3 F (36.8 C) (Oral)   Resp 16   Ht 5\' 11"  (1.803 m)   Wt 170 lb (77.1 kg)   SpO2 95%   BMI 23.71 kg/m   Physical Exam Physical Exam  Nursing note and vitals reviewed. Constitutional: Well developed, well nourished, non-toxic, and in no acute distress Head: Normocephalic and atraumatic.  Mouth/Throat: Oropharynx is clear and moist.  Neck: Normal range of motion. Neck supple.  Cardiovascular: Normal rate and regular rhythm.   Pulmonary/Chest: Effort normal and breath sounds normal.  Abdominal: Soft. There is no tenderness. There is no rebound and no guarding.  GU: Foley catheter in place draining hematuria and blood clot  Musculoskeletal: Normal range of motion.  Neurological: Alert, no facial droop, fluent speech, moves all extremities symmetrically Skin: Skin is warm and dry.  Psychiatric: Cooperative   ED Treatments / Results  Labs (all labs ordered are listed, but only abnormal results are displayed) Labs Reviewed  CBC WITH DIFFERENTIAL/PLATELET - Abnormal; Notable for the following:       Result Value   WBC 13.8 (*)    RBC 3.61 (*)    Hemoglobin 11.1 (*)    HCT 31.3 (*)    Neutro Abs 11.3 (*)    All other components within normal limits  BASIC METABOLIC PANEL - Abnormal; Notable for the following:    Sodium 132 (*)    Chloride 98 (*)    Glucose, Bld 138 (*)    GFR calc non Af Amer 59 (*)    All other components within normal limits  URINALYSIS, ROUTINE W REFLEX MICROSCOPIC - Abnormal; Notable for the following:    Color, Urine RED (*)    APPearance TURBID (*)    Glucose, UA   (*)    Value: TEST NOT REPORTED DUE TO COLOR INTERFERENCE OF URINE PIGMENT   Hgb urine dipstick   (*)    Value: TEST NOT REPORTED DUE TO  COLOR INTERFERENCE OF URINE PIGMENT   Bilirubin Urine   (*)    Value: TEST NOT REPORTED DUE TO COLOR INTERFERENCE OF URINE PIGMENT   Ketones, ur   (*)    Value: TEST NOT REPORTED DUE TO COLOR INTERFERENCE OF URINE PIGMENT   Protein, ur   (*)    Value: TEST NOT REPORTED DUE TO COLOR INTERFERENCE OF URINE PIGMENT   Nitrite   (*)    Value: TEST NOT REPORTED DUE TO COLOR INTERFERENCE OF URINE PIGMENT   Leukocytes, UA   (*)    Value: TEST NOT REPORTED DUE TO COLOR INTERFERENCE OF URINE PIGMENT   All other components within normal limits  URINALYSIS, MICROSCOPIC (REFLEX) - Abnormal; Notable for the following:    Bacteria, UA MANY (*)    All other components within normal limits  URINE CULTURE    EKG  EKG Interpretation None       Radiology No results found.  Procedures Procedures (including critical care time)  Medications Ordered in ED Medications - No data to display   Initial Impression / Assessment and Plan / ED Course  I have reviewed the triage vital signs and the nursing notes.  Pertinent labs & imaging results that were available during my care of the patient were reviewed by me and considered in my medical decision making (see chart for details).     Presenting with urinary retention in the setting of hematuria. Unable to flush Foley catheter, so new 20 French Foley catheter with coud tip was placed. Subsequent had relief of hypertension and persistent hematuria. Foley was irrigated. He is otherwise well-appearing with stable vital signs. He is stable anemia. Stable renal function.  UA was significant amount of blood, and unable to evaluate for infection.  We'll send for culture. Discussed with Dr. McDiarmid who recommended continued outpatient follow-up as scheduled as long as foley draining. Strict return and follow-up instructions reviewed. He expressed understanding of all discharge instructions and felt comfortable with the plan of care.   Final Clinical  Impressions(s) / ED Diagnoses   Final diagnoses:  Problem with Foley catheter, initial encounter [facility])  Urinary retention  Gross hematuria    New Prescriptions New Prescriptions   No medications on file     Forde Dandy, MD 04/18/16 2239

## 2016-04-18 NOTE — Discharge Instructions (Signed)
We replaced your urinary catheter today.  Please return without fail for worsening symptoms, including fever, intractable vomiting, confusion, abdominal pain, recurrent retention, or any other symptoms concerning to you.  It is okay that you still have blood in the urine per our urologist, but if you start feeling tired, fatigued, pass out, short of breath, you should probably have your blood counts checked by a physician.

## 2016-04-18 NOTE — ED Triage Notes (Signed)
Pt c/o bleeding from distal urethra at site of foley catheter, blood and blood clots in catheter, stopped urinating onset today. Bilateral flank pain x 2-3 days. Dr. Herbert Spires office instructed pt to come to ED. Total prostatectomy 1 week ago.

## 2016-04-21 LAB — URINE CULTURE: Culture: >=100000 — AB

## 2016-04-22 ENCOUNTER — Telehealth: Payer: Self-pay | Admitting: Emergency Medicine

## 2016-04-22 NOTE — Telephone Encounter (Signed)
Post ED Visit - Positive Culture Follow-up  Culture report reviewed by antimicrobial stewardship pharmacist:  []  Elenor Quinones, Pharm.D. [x]  Heide Guile, Pharm.D., BCPS AQ-ID []  Parks Neptune, Pharm.D., BCPS []  Alycia Rossetti, Pharm.D., BCPS []  [place], Pharm.D., BCPS, AAHIVP []  Legrand Como, Pharm.D., BCPS, AAHIVP []  Salome Arnt, PharmD, BCPS []  Dimitri Ped, PharmD, BCPS []  Vincenza Hews, PharmD, BCPS  Positive urine culture Treated with none, foley catheter changed with resolution of symptoms, to f/u with urologist  Hazle Nordmann 04/22/2016, 1:45 PM

## 2016-04-23 DIAGNOSIS — C61 Malignant neoplasm of prostate: Secondary | ICD-10-CM | POA: Diagnosis not present

## 2016-04-23 DIAGNOSIS — N393 Stress incontinence (female) (male): Secondary | ICD-10-CM | POA: Diagnosis not present

## 2016-05-10 DIAGNOSIS — N393 Stress incontinence (female) (male): Secondary | ICD-10-CM | POA: Diagnosis not present

## 2016-05-10 DIAGNOSIS — M6281 Muscle weakness (generalized): Secondary | ICD-10-CM | POA: Diagnosis not present

## 2016-05-10 DIAGNOSIS — M62838 Other muscle spasm: Secondary | ICD-10-CM | POA: Diagnosis not present

## 2016-05-27 DIAGNOSIS — M6281 Muscle weakness (generalized): Secondary | ICD-10-CM | POA: Diagnosis not present

## 2016-05-27 DIAGNOSIS — M62838 Other muscle spasm: Secondary | ICD-10-CM | POA: Diagnosis not present

## 2016-05-27 DIAGNOSIS — N393 Stress incontinence (female) (male): Secondary | ICD-10-CM | POA: Diagnosis not present

## 2016-06-26 DIAGNOSIS — N393 Stress incontinence (female) (male): Secondary | ICD-10-CM | POA: Diagnosis not present

## 2016-06-26 DIAGNOSIS — M62838 Other muscle spasm: Secondary | ICD-10-CM | POA: Diagnosis not present

## 2016-06-26 DIAGNOSIS — M6281 Muscle weakness (generalized): Secondary | ICD-10-CM | POA: Diagnosis not present

## 2016-07-12 DIAGNOSIS — E1165 Type 2 diabetes mellitus with hyperglycemia: Secondary | ICD-10-CM | POA: Diagnosis not present

## 2016-07-12 DIAGNOSIS — Z7984 Long term (current) use of oral hypoglycemic drugs: Secondary | ICD-10-CM | POA: Diagnosis not present

## 2016-07-12 DIAGNOSIS — E7800 Pure hypercholesterolemia, unspecified: Secondary | ICD-10-CM | POA: Diagnosis not present

## 2016-07-12 DIAGNOSIS — L719 Rosacea, unspecified: Secondary | ICD-10-CM | POA: Diagnosis not present

## 2016-07-12 DIAGNOSIS — I10 Essential (primary) hypertension: Secondary | ICD-10-CM | POA: Diagnosis not present

## 2016-07-12 DIAGNOSIS — E119 Type 2 diabetes mellitus without complications: Secondary | ICD-10-CM | POA: Diagnosis not present

## 2016-07-26 DIAGNOSIS — C61 Malignant neoplasm of prostate: Secondary | ICD-10-CM | POA: Diagnosis not present

## 2016-08-01 DIAGNOSIS — C61 Malignant neoplasm of prostate: Secondary | ICD-10-CM | POA: Diagnosis not present

## 2016-08-01 DIAGNOSIS — N393 Stress incontinence (female) (male): Secondary | ICD-10-CM | POA: Diagnosis not present

## 2016-08-01 DIAGNOSIS — N5201 Erectile dysfunction due to arterial insufficiency: Secondary | ICD-10-CM | POA: Diagnosis not present

## 2016-11-22 DIAGNOSIS — Z85828 Personal history of other malignant neoplasm of skin: Secondary | ICD-10-CM | POA: Diagnosis not present

## 2016-11-22 DIAGNOSIS — L570 Actinic keratosis: Secondary | ICD-10-CM | POA: Diagnosis not present

## 2016-11-22 DIAGNOSIS — L218 Other seborrheic dermatitis: Secondary | ICD-10-CM | POA: Diagnosis not present

## 2016-11-22 DIAGNOSIS — D485 Neoplasm of uncertain behavior of skin: Secondary | ICD-10-CM | POA: Diagnosis not present

## 2016-11-22 DIAGNOSIS — L821 Other seborrheic keratosis: Secondary | ICD-10-CM | POA: Diagnosis not present

## 2016-11-22 DIAGNOSIS — L308 Other specified dermatitis: Secondary | ICD-10-CM | POA: Diagnosis not present

## 2017-01-02 DIAGNOSIS — Z23 Encounter for immunization: Secondary | ICD-10-CM | POA: Diagnosis not present

## 2017-01-29 DIAGNOSIS — C61 Malignant neoplasm of prostate: Secondary | ICD-10-CM | POA: Diagnosis not present

## 2017-02-10 DIAGNOSIS — C61 Malignant neoplasm of prostate: Secondary | ICD-10-CM | POA: Diagnosis not present

## 2017-02-10 DIAGNOSIS — N393 Stress incontinence (female) (male): Secondary | ICD-10-CM | POA: Diagnosis not present

## 2017-02-10 DIAGNOSIS — N5201 Erectile dysfunction due to arterial insufficiency: Secondary | ICD-10-CM | POA: Diagnosis not present

## 2017-02-17 DIAGNOSIS — I10 Essential (primary) hypertension: Secondary | ICD-10-CM | POA: Diagnosis not present

## 2017-02-17 DIAGNOSIS — E119 Type 2 diabetes mellitus without complications: Secondary | ICD-10-CM | POA: Diagnosis not present

## 2017-02-17 DIAGNOSIS — E7800 Pure hypercholesterolemia, unspecified: Secondary | ICD-10-CM | POA: Diagnosis not present

## 2017-02-17 DIAGNOSIS — Z7984 Long term (current) use of oral hypoglycemic drugs: Secondary | ICD-10-CM | POA: Diagnosis not present

## 2017-04-29 DIAGNOSIS — Z111 Encounter for screening for respiratory tuberculosis: Secondary | ICD-10-CM | POA: Diagnosis not present

## 2017-08-18 DIAGNOSIS — E7800 Pure hypercholesterolemia, unspecified: Secondary | ICD-10-CM | POA: Diagnosis not present

## 2017-08-18 DIAGNOSIS — I10 Essential (primary) hypertension: Secondary | ICD-10-CM | POA: Diagnosis not present

## 2017-08-18 DIAGNOSIS — E119 Type 2 diabetes mellitus without complications: Secondary | ICD-10-CM | POA: Diagnosis not present

## 2017-08-18 DIAGNOSIS — Z1159 Encounter for screening for other viral diseases: Secondary | ICD-10-CM | POA: Diagnosis not present

## 2017-10-29 DIAGNOSIS — J069 Acute upper respiratory infection, unspecified: Secondary | ICD-10-CM | POA: Diagnosis not present

## 2017-11-07 DIAGNOSIS — Z23 Encounter for immunization: Secondary | ICD-10-CM | POA: Diagnosis not present

## 2017-11-28 DIAGNOSIS — L570 Actinic keratosis: Secondary | ICD-10-CM | POA: Diagnosis not present

## 2017-11-28 DIAGNOSIS — L2089 Other atopic dermatitis: Secondary | ICD-10-CM | POA: Diagnosis not present

## 2017-11-28 DIAGNOSIS — Z85828 Personal history of other malignant neoplasm of skin: Secondary | ICD-10-CM | POA: Diagnosis not present

## 2017-11-28 DIAGNOSIS — D225 Melanocytic nevi of trunk: Secondary | ICD-10-CM | POA: Diagnosis not present

## 2017-11-28 DIAGNOSIS — C44622 Squamous cell carcinoma of skin of right upper limb, including shoulder: Secondary | ICD-10-CM | POA: Diagnosis not present

## 2017-11-28 DIAGNOSIS — L821 Other seborrheic keratosis: Secondary | ICD-10-CM | POA: Diagnosis not present

## 2018-02-20 DIAGNOSIS — I10 Essential (primary) hypertension: Secondary | ICD-10-CM | POA: Diagnosis not present

## 2018-02-20 DIAGNOSIS — E7800 Pure hypercholesterolemia, unspecified: Secondary | ICD-10-CM | POA: Diagnosis not present

## 2018-02-20 DIAGNOSIS — E119 Type 2 diabetes mellitus without complications: Secondary | ICD-10-CM | POA: Diagnosis not present

## 2018-05-29 DIAGNOSIS — Z7984 Long term (current) use of oral hypoglycemic drugs: Secondary | ICD-10-CM | POA: Diagnosis not present

## 2018-05-29 DIAGNOSIS — E1165 Type 2 diabetes mellitus with hyperglycemia: Secondary | ICD-10-CM | POA: Diagnosis not present

## 2018-08-27 DIAGNOSIS — Z7984 Long term (current) use of oral hypoglycemic drugs: Secondary | ICD-10-CM | POA: Diagnosis not present

## 2018-08-27 DIAGNOSIS — E1121 Type 2 diabetes mellitus with diabetic nephropathy: Secondary | ICD-10-CM | POA: Diagnosis not present

## 2018-08-27 DIAGNOSIS — E7800 Pure hypercholesterolemia, unspecified: Secondary | ICD-10-CM | POA: Diagnosis not present

## 2018-08-27 DIAGNOSIS — I10 Essential (primary) hypertension: Secondary | ICD-10-CM | POA: Diagnosis not present

## 2018-08-28 DIAGNOSIS — Z7984 Long term (current) use of oral hypoglycemic drugs: Secondary | ICD-10-CM | POA: Diagnosis not present

## 2018-08-28 DIAGNOSIS — E7800 Pure hypercholesterolemia, unspecified: Secondary | ICD-10-CM | POA: Diagnosis not present

## 2018-08-28 DIAGNOSIS — E1121 Type 2 diabetes mellitus with diabetic nephropathy: Secondary | ICD-10-CM | POA: Diagnosis not present

## 2018-08-28 DIAGNOSIS — I10 Essential (primary) hypertension: Secondary | ICD-10-CM | POA: Diagnosis not present

## 2018-10-11 DIAGNOSIS — Z23 Encounter for immunization: Secondary | ICD-10-CM | POA: Diagnosis not present

## 2018-12-11 DIAGNOSIS — L308 Other specified dermatitis: Secondary | ICD-10-CM | POA: Diagnosis not present

## 2018-12-11 DIAGNOSIS — D1801 Hemangioma of skin and subcutaneous tissue: Secondary | ICD-10-CM | POA: Diagnosis not present

## 2018-12-11 DIAGNOSIS — D485 Neoplasm of uncertain behavior of skin: Secondary | ICD-10-CM | POA: Diagnosis not present

## 2018-12-11 DIAGNOSIS — L821 Other seborrheic keratosis: Secondary | ICD-10-CM | POA: Diagnosis not present

## 2018-12-11 DIAGNOSIS — L853 Xerosis cutis: Secondary | ICD-10-CM | POA: Diagnosis not present

## 2018-12-11 DIAGNOSIS — D225 Melanocytic nevi of trunk: Secondary | ICD-10-CM | POA: Diagnosis not present

## 2018-12-11 DIAGNOSIS — Z85828 Personal history of other malignant neoplasm of skin: Secondary | ICD-10-CM | POA: Diagnosis not present

## 2018-12-11 DIAGNOSIS — L570 Actinic keratosis: Secondary | ICD-10-CM | POA: Diagnosis not present

## 2018-12-11 DIAGNOSIS — D2262 Melanocytic nevi of left upper limb, including shoulder: Secondary | ICD-10-CM | POA: Diagnosis not present

## 2019-03-01 DIAGNOSIS — E119 Type 2 diabetes mellitus without complications: Secondary | ICD-10-CM | POA: Diagnosis not present

## 2019-03-01 DIAGNOSIS — E7800 Pure hypercholesterolemia, unspecified: Secondary | ICD-10-CM | POA: Diagnosis not present

## 2019-03-01 DIAGNOSIS — I10 Essential (primary) hypertension: Secondary | ICD-10-CM | POA: Diagnosis not present

## 2019-03-01 DIAGNOSIS — L309 Dermatitis, unspecified: Secondary | ICD-10-CM | POA: Diagnosis not present

## 2019-08-30 DIAGNOSIS — E1121 Type 2 diabetes mellitus with diabetic nephropathy: Secondary | ICD-10-CM | POA: Diagnosis not present

## 2019-08-30 DIAGNOSIS — R609 Edema, unspecified: Secondary | ICD-10-CM | POA: Diagnosis not present

## 2019-08-30 DIAGNOSIS — E7800 Pure hypercholesterolemia, unspecified: Secondary | ICD-10-CM | POA: Diagnosis not present

## 2019-08-30 DIAGNOSIS — Z7984 Long term (current) use of oral hypoglycemic drugs: Secondary | ICD-10-CM | POA: Diagnosis not present

## 2019-08-30 DIAGNOSIS — I10 Essential (primary) hypertension: Secondary | ICD-10-CM | POA: Diagnosis not present

## 2019-08-30 DIAGNOSIS — M25569 Pain in unspecified knee: Secondary | ICD-10-CM | POA: Diagnosis not present

## 2019-10-06 DIAGNOSIS — M25561 Pain in right knee: Secondary | ICD-10-CM | POA: Diagnosis not present

## 2019-10-06 DIAGNOSIS — M170 Bilateral primary osteoarthritis of knee: Secondary | ICD-10-CM | POA: Diagnosis not present

## 2019-10-06 DIAGNOSIS — M25562 Pain in left knee: Secondary | ICD-10-CM | POA: Diagnosis not present

## 2019-10-13 DIAGNOSIS — M25562 Pain in left knee: Secondary | ICD-10-CM | POA: Diagnosis not present

## 2019-10-13 DIAGNOSIS — M170 Bilateral primary osteoarthritis of knee: Secondary | ICD-10-CM | POA: Diagnosis not present

## 2019-10-13 DIAGNOSIS — M25561 Pain in right knee: Secondary | ICD-10-CM | POA: Diagnosis not present

## 2019-10-21 DIAGNOSIS — M170 Bilateral primary osteoarthritis of knee: Secondary | ICD-10-CM | POA: Diagnosis not present

## 2019-10-21 DIAGNOSIS — M25561 Pain in right knee: Secondary | ICD-10-CM | POA: Diagnosis not present

## 2019-10-21 DIAGNOSIS — M25562 Pain in left knee: Secondary | ICD-10-CM | POA: Diagnosis not present

## 2019-10-28 ENCOUNTER — Other Ambulatory Visit: Payer: Self-pay | Admitting: Gastroenterology

## 2019-10-28 DIAGNOSIS — M25561 Pain in right knee: Secondary | ICD-10-CM | POA: Diagnosis not present

## 2019-10-28 DIAGNOSIS — M25562 Pain in left knee: Secondary | ICD-10-CM | POA: Diagnosis not present

## 2019-10-28 DIAGNOSIS — M170 Bilateral primary osteoarthritis of knee: Secondary | ICD-10-CM | POA: Diagnosis not present

## 2019-10-28 DIAGNOSIS — R1310 Dysphagia, unspecified: Secondary | ICD-10-CM

## 2019-11-03 ENCOUNTER — Other Ambulatory Visit: Payer: Medicare Other

## 2019-11-04 ENCOUNTER — Other Ambulatory Visit: Payer: Medicare Other

## 2019-11-04 DIAGNOSIS — M170 Bilateral primary osteoarthritis of knee: Secondary | ICD-10-CM | POA: Diagnosis not present

## 2019-11-11 ENCOUNTER — Ambulatory Visit
Admission: RE | Admit: 2019-11-11 | Discharge: 2019-11-11 | Disposition: A | Discharge status: Home / Self Care | Payer: Medicare Other | Source: Ambulatory Visit | Attending: Gastroenterology | Admitting: Gastroenterology

## 2019-11-11 DIAGNOSIS — K224 Dyskinesia of esophagus: Secondary | ICD-10-CM | POA: Diagnosis not present

## 2019-11-11 DIAGNOSIS — R1310 Dysphagia, unspecified: Secondary | ICD-10-CM

## 2019-11-11 DIAGNOSIS — K449 Diaphragmatic hernia without obstruction or gangrene: Secondary | ICD-10-CM | POA: Diagnosis not present

## 2019-11-11 DIAGNOSIS — M170 Bilateral primary osteoarthritis of knee: Secondary | ICD-10-CM | POA: Diagnosis not present

## 2019-11-17 DIAGNOSIS — Z01812 Encounter for preprocedural laboratory examination: Secondary | ICD-10-CM | POA: Diagnosis not present

## 2019-11-19 DIAGNOSIS — K2100 Gastro-esophageal reflux disease with esophagitis, without bleeding: Secondary | ICD-10-CM | POA: Diagnosis not present

## 2019-11-19 DIAGNOSIS — K222 Esophageal obstruction: Secondary | ICD-10-CM | POA: Diagnosis not present

## 2019-11-19 DIAGNOSIS — R933 Abnormal findings on diagnostic imaging of other parts of digestive tract: Secondary | ICD-10-CM | POA: Diagnosis not present

## 2019-11-19 DIAGNOSIS — K449 Diaphragmatic hernia without obstruction or gangrene: Secondary | ICD-10-CM | POA: Diagnosis not present

## 2019-11-19 DIAGNOSIS — R1310 Dysphagia, unspecified: Secondary | ICD-10-CM | POA: Diagnosis not present

## 2019-12-20 DIAGNOSIS — L570 Actinic keratosis: Secondary | ICD-10-CM | POA: Diagnosis not present

## 2019-12-20 DIAGNOSIS — L814 Other melanin hyperpigmentation: Secondary | ICD-10-CM | POA: Diagnosis not present

## 2019-12-20 DIAGNOSIS — Z85828 Personal history of other malignant neoplasm of skin: Secondary | ICD-10-CM | POA: Diagnosis not present

## 2019-12-20 DIAGNOSIS — D485 Neoplasm of uncertain behavior of skin: Secondary | ICD-10-CM | POA: Diagnosis not present

## 2019-12-20 DIAGNOSIS — D225 Melanocytic nevi of trunk: Secondary | ICD-10-CM | POA: Diagnosis not present

## 2019-12-20 DIAGNOSIS — L821 Other seborrheic keratosis: Secondary | ICD-10-CM | POA: Diagnosis not present

## 2020-01-11 DIAGNOSIS — D485 Neoplasm of uncertain behavior of skin: Secondary | ICD-10-CM | POA: Diagnosis not present

## 2020-01-11 DIAGNOSIS — Z85828 Personal history of other malignant neoplasm of skin: Secondary | ICD-10-CM | POA: Diagnosis not present

## 2020-01-11 DIAGNOSIS — L988 Other specified disorders of the skin and subcutaneous tissue: Secondary | ICD-10-CM | POA: Diagnosis not present

## 2020-02-10 DIAGNOSIS — G894 Chronic pain syndrome: Secondary | ICD-10-CM | POA: Diagnosis not present

## 2020-02-10 DIAGNOSIS — M170 Bilateral primary osteoarthritis of knee: Secondary | ICD-10-CM | POA: Diagnosis not present

## 2020-02-25 DIAGNOSIS — E7800 Pure hypercholesterolemia, unspecified: Secondary | ICD-10-CM | POA: Diagnosis not present

## 2020-02-25 DIAGNOSIS — E1121 Type 2 diabetes mellitus with diabetic nephropathy: Secondary | ICD-10-CM | POA: Diagnosis not present

## 2020-02-25 DIAGNOSIS — R1310 Dysphagia, unspecified: Secondary | ICD-10-CM | POA: Diagnosis not present

## 2020-02-25 DIAGNOSIS — I10 Essential (primary) hypertension: Secondary | ICD-10-CM | POA: Diagnosis not present

## 2020-02-25 DIAGNOSIS — Z8546 Personal history of malignant neoplasm of prostate: Secondary | ICD-10-CM | POA: Diagnosis not present

## 2020-02-25 DIAGNOSIS — M25569 Pain in unspecified knee: Secondary | ICD-10-CM | POA: Diagnosis not present

## 2020-02-25 DIAGNOSIS — Z0000 Encounter for general adult medical examination without abnormal findings: Secondary | ICD-10-CM | POA: Diagnosis not present

## 2020-05-15 DIAGNOSIS — M170 Bilateral primary osteoarthritis of knee: Secondary | ICD-10-CM | POA: Diagnosis not present

## 2020-07-03 DIAGNOSIS — M9902 Segmental and somatic dysfunction of thoracic region: Secondary | ICD-10-CM | POA: Diagnosis not present

## 2020-07-03 DIAGNOSIS — M25571 Pain in right ankle and joints of right foot: Secondary | ICD-10-CM | POA: Diagnosis not present

## 2020-07-03 DIAGNOSIS — M9905 Segmental and somatic dysfunction of pelvic region: Secondary | ICD-10-CM | POA: Diagnosis not present

## 2020-07-03 DIAGNOSIS — M5441 Lumbago with sciatica, right side: Secondary | ICD-10-CM | POA: Diagnosis not present

## 2020-07-03 DIAGNOSIS — M9903 Segmental and somatic dysfunction of lumbar region: Secondary | ICD-10-CM | POA: Diagnosis not present

## 2020-07-05 DIAGNOSIS — M9905 Segmental and somatic dysfunction of pelvic region: Secondary | ICD-10-CM | POA: Diagnosis not present

## 2020-07-05 DIAGNOSIS — M9902 Segmental and somatic dysfunction of thoracic region: Secondary | ICD-10-CM | POA: Diagnosis not present

## 2020-07-05 DIAGNOSIS — M5441 Lumbago with sciatica, right side: Secondary | ICD-10-CM | POA: Diagnosis not present

## 2020-07-05 DIAGNOSIS — M25571 Pain in right ankle and joints of right foot: Secondary | ICD-10-CM | POA: Diagnosis not present

## 2020-07-05 DIAGNOSIS — M9903 Segmental and somatic dysfunction of lumbar region: Secondary | ICD-10-CM | POA: Diagnosis not present

## 2020-07-07 DIAGNOSIS — M9905 Segmental and somatic dysfunction of pelvic region: Secondary | ICD-10-CM | POA: Diagnosis not present

## 2020-07-07 DIAGNOSIS — M25571 Pain in right ankle and joints of right foot: Secondary | ICD-10-CM | POA: Diagnosis not present

## 2020-07-07 DIAGNOSIS — M9902 Segmental and somatic dysfunction of thoracic region: Secondary | ICD-10-CM | POA: Diagnosis not present

## 2020-07-07 DIAGNOSIS — M9903 Segmental and somatic dysfunction of lumbar region: Secondary | ICD-10-CM | POA: Diagnosis not present

## 2020-07-07 DIAGNOSIS — M5441 Lumbago with sciatica, right side: Secondary | ICD-10-CM | POA: Diagnosis not present

## 2020-07-10 DIAGNOSIS — M9905 Segmental and somatic dysfunction of pelvic region: Secondary | ICD-10-CM | POA: Diagnosis not present

## 2020-07-10 DIAGNOSIS — M9902 Segmental and somatic dysfunction of thoracic region: Secondary | ICD-10-CM | POA: Diagnosis not present

## 2020-07-10 DIAGNOSIS — M9903 Segmental and somatic dysfunction of lumbar region: Secondary | ICD-10-CM | POA: Diagnosis not present

## 2020-07-12 DIAGNOSIS — M9905 Segmental and somatic dysfunction of pelvic region: Secondary | ICD-10-CM | POA: Diagnosis not present

## 2020-07-12 DIAGNOSIS — M9902 Segmental and somatic dysfunction of thoracic region: Secondary | ICD-10-CM | POA: Diagnosis not present

## 2020-07-12 DIAGNOSIS — M9903 Segmental and somatic dysfunction of lumbar region: Secondary | ICD-10-CM | POA: Diagnosis not present

## 2020-07-14 DIAGNOSIS — M9902 Segmental and somatic dysfunction of thoracic region: Secondary | ICD-10-CM | POA: Diagnosis not present

## 2020-07-14 DIAGNOSIS — M9903 Segmental and somatic dysfunction of lumbar region: Secondary | ICD-10-CM | POA: Diagnosis not present

## 2020-07-14 DIAGNOSIS — M9905 Segmental and somatic dysfunction of pelvic region: Secondary | ICD-10-CM | POA: Diagnosis not present

## 2020-07-17 DIAGNOSIS — M9903 Segmental and somatic dysfunction of lumbar region: Secondary | ICD-10-CM | POA: Diagnosis not present

## 2020-07-17 DIAGNOSIS — M9902 Segmental and somatic dysfunction of thoracic region: Secondary | ICD-10-CM | POA: Diagnosis not present

## 2020-07-17 DIAGNOSIS — M9905 Segmental and somatic dysfunction of pelvic region: Secondary | ICD-10-CM | POA: Diagnosis not present

## 2020-07-19 DIAGNOSIS — M9905 Segmental and somatic dysfunction of pelvic region: Secondary | ICD-10-CM | POA: Diagnosis not present

## 2020-07-19 DIAGNOSIS — M9903 Segmental and somatic dysfunction of lumbar region: Secondary | ICD-10-CM | POA: Diagnosis not present

## 2020-07-19 DIAGNOSIS — M9902 Segmental and somatic dysfunction of thoracic region: Secondary | ICD-10-CM | POA: Diagnosis not present

## 2020-07-21 DIAGNOSIS — M9903 Segmental and somatic dysfunction of lumbar region: Secondary | ICD-10-CM | POA: Diagnosis not present

## 2020-07-21 DIAGNOSIS — M9902 Segmental and somatic dysfunction of thoracic region: Secondary | ICD-10-CM | POA: Diagnosis not present

## 2020-07-21 DIAGNOSIS — M9905 Segmental and somatic dysfunction of pelvic region: Secondary | ICD-10-CM | POA: Diagnosis not present

## 2020-07-26 DIAGNOSIS — M9903 Segmental and somatic dysfunction of lumbar region: Secondary | ICD-10-CM | POA: Diagnosis not present

## 2020-07-26 DIAGNOSIS — M9905 Segmental and somatic dysfunction of pelvic region: Secondary | ICD-10-CM | POA: Diagnosis not present

## 2020-07-26 DIAGNOSIS — M9902 Segmental and somatic dysfunction of thoracic region: Secondary | ICD-10-CM | POA: Diagnosis not present

## 2020-07-28 DIAGNOSIS — M9902 Segmental and somatic dysfunction of thoracic region: Secondary | ICD-10-CM | POA: Diagnosis not present

## 2020-07-28 DIAGNOSIS — M9905 Segmental and somatic dysfunction of pelvic region: Secondary | ICD-10-CM | POA: Diagnosis not present

## 2020-07-28 DIAGNOSIS — M9903 Segmental and somatic dysfunction of lumbar region: Secondary | ICD-10-CM | POA: Diagnosis not present

## 2020-08-02 DIAGNOSIS — M9902 Segmental and somatic dysfunction of thoracic region: Secondary | ICD-10-CM | POA: Diagnosis not present

## 2020-08-02 DIAGNOSIS — M9905 Segmental and somatic dysfunction of pelvic region: Secondary | ICD-10-CM | POA: Diagnosis not present

## 2020-08-02 DIAGNOSIS — M5451 Vertebrogenic low back pain: Secondary | ICD-10-CM | POA: Diagnosis not present

## 2020-08-02 DIAGNOSIS — M9903 Segmental and somatic dysfunction of lumbar region: Secondary | ICD-10-CM | POA: Diagnosis not present

## 2020-08-25 DIAGNOSIS — I10 Essential (primary) hypertension: Secondary | ICD-10-CM | POA: Diagnosis not present

## 2020-08-25 DIAGNOSIS — E7800 Pure hypercholesterolemia, unspecified: Secondary | ICD-10-CM | POA: Diagnosis not present

## 2020-08-25 DIAGNOSIS — E1121 Type 2 diabetes mellitus with diabetic nephropathy: Secondary | ICD-10-CM | POA: Diagnosis not present

## 2020-12-18 DIAGNOSIS — Z85828 Personal history of other malignant neoplasm of skin: Secondary | ICD-10-CM | POA: Diagnosis not present

## 2020-12-18 DIAGNOSIS — L820 Inflamed seborrheic keratosis: Secondary | ICD-10-CM | POA: Diagnosis not present

## 2020-12-18 DIAGNOSIS — D0462 Carcinoma in situ of skin of left upper limb, including shoulder: Secondary | ICD-10-CM | POA: Diagnosis not present

## 2020-12-18 DIAGNOSIS — L821 Other seborrheic keratosis: Secondary | ICD-10-CM | POA: Diagnosis not present

## 2020-12-18 DIAGNOSIS — L814 Other melanin hyperpigmentation: Secondary | ICD-10-CM | POA: Diagnosis not present

## 2020-12-26 ENCOUNTER — Emergency Department (HOSPITAL_COMMUNITY): Payer: Medicare Other

## 2020-12-26 ENCOUNTER — Other Ambulatory Visit: Payer: Self-pay

## 2020-12-26 ENCOUNTER — Encounter (HOSPITAL_COMMUNITY): Payer: Self-pay

## 2020-12-26 ENCOUNTER — Emergency Department (HOSPITAL_COMMUNITY)
Admission: EM | Admit: 2020-12-26 | Discharge: 2020-12-26 | Disposition: A | Discharge status: Home / Self Care | Payer: Medicare Other | Attending: Emergency Medicine | Admitting: Emergency Medicine

## 2020-12-26 DIAGNOSIS — E119 Type 2 diabetes mellitus without complications: Secondary | ICD-10-CM | POA: Insufficient documentation

## 2020-12-26 DIAGNOSIS — I10 Essential (primary) hypertension: Secondary | ICD-10-CM | POA: Diagnosis not present

## 2020-12-26 DIAGNOSIS — Z79899 Other long term (current) drug therapy: Secondary | ICD-10-CM | POA: Insufficient documentation

## 2020-12-26 DIAGNOSIS — U071 COVID-19: Secondary | ICD-10-CM | POA: Diagnosis not present

## 2020-12-26 DIAGNOSIS — Z8546 Personal history of malignant neoplasm of prostate: Secondary | ICD-10-CM | POA: Insufficient documentation

## 2020-12-26 DIAGNOSIS — Z7984 Long term (current) use of oral hypoglycemic drugs: Secondary | ICD-10-CM | POA: Diagnosis not present

## 2020-12-26 DIAGNOSIS — R059 Cough, unspecified: Secondary | ICD-10-CM | POA: Diagnosis present

## 2020-12-26 DIAGNOSIS — R0602 Shortness of breath: Secondary | ICD-10-CM | POA: Diagnosis not present

## 2020-12-26 LAB — RESP PANEL BY RT-PCR (FLU A&B, COVID) ARPGX2
Influenza A by PCR: NEGATIVE
Influenza B by PCR: NEGATIVE
SARS Coronavirus 2 by RT PCR: POSITIVE — AB

## 2020-12-26 NOTE — ED Provider Notes (Signed)
Emergency Medicine Provider Triage Evaluation Note  Mitchell Frank. , a 70 y.o. male  was evaluated in triage.  Pt complains of fatigue, cough, and chills for the past few days.  Wife with similar symptoms tested positive for COVID yesterday.  Patient denies chest pain however, endorses shortness of breath.  Review of Systems  Positive: Cough, SOB Negative: CP  Physical Exam  BP (!) 142/69 (BP Location: Right Arm)   Pulse 68   Temp 98.5 F (36.9 C)   Resp 18   SpO2 98%  Gen:   Awake, no distress   Resp:  Normal effort  MSK:   Moves extremities without difficulty  Other:    Medical Decision Making  Medically screening exam initiated at 4:16 PM.  Appropriate orders placed.  Mitchell Frank. was informed that the remainder of the evaluation will be completed by another provider, this initial triage assessment does not replace that evaluation, and the importance of remaining in the ED until their evaluation is complete.  COVID and CXR   Karie Kirks 12/26/20 1618    Wyvonnia Dusky, MD 12/26/20 [phone]

## 2020-12-26 NOTE — Discharge Instructions (Signed)
You have COVID.  Your symptoms are going on for 7 to 10 days already.   You can go back to work per guidelines from your employer  See your doctor for follow up  Return to ER if you have trouble breathing, shortness of breath, fever, vomiting

## 2020-12-26 NOTE — ED Triage Notes (Signed)
Patient complains of fatigue, cough and chills for the past several days. Spouse took home test and is positive and patient has not tested. Alert and oriented, NAD

## 2020-12-26 NOTE — ED Provider Notes (Signed)
[facility] EMERGENCY DEPARTMENT Provider Note   CSN: [id number] Arrival date & time: 12/26/20  1512     History No chief complaint on file.   Mitchell Cauble. is a 70 y.o. male history of diabetes, hypertension here presenting with possible COVID.  Patient has been having cough and congestion and myalgias for the last week or so.  Patient has some runny nose as well. Patient's wife is sick with similar symptoms.  They had a home test and she tested positive for COVID yesterday.  Patient states that he is not running fevers.  He denies any trouble breathing.  Patient received 2 Pfizer vaccines but did not get the booster shots.  The history is provided by the patient.      Past Medical History:  Diagnosis Date   Arthritis    Diabetes mellitus    GERD (gastroesophageal reflux disease)    Hypertension     Patient Active Problem List   Diagnosis Date Noted   Prostate cancer ([place]) 04/10/2016    Past Surgical History:  Procedure Laterality Date   ANTERIOR CERVICAL DECOMP/DISCECTOMY FUSION  01/16/2011   Procedure: ANTERIOR CERVICAL DECOMPRESSION/DISCECTOMY FUSION 2 LEVELS;  Surgeon: Sinclair Ship;  Location: [place];  Service: Orthopedics;  Laterality: Left;  Anterior cervical decompression fusion, cervical 3-4, cervical 4-5 with instrumentation and allograft.   CATARACT EXTRACTION     right   KNEE ARTHROSCOPY     right   KNEE CARTILAGE SURGERY     left knee   LYMPHADENECTOMY Bilateral 04/10/2016   Procedure: LYMPHADENECTOMY;  Surgeon: Alexis Frock, MD;  Location: [facility];  Service: Urology;  Laterality: Bilateral;   ROBOT ASSISTED LAPAROSCOPIC RADICAL PROSTATECTOMY N/A 04/10/2016   Procedure: XI ROBOTIC ASSISTED LAPAROSCOPIC RADICAL PROSTATECTOMY WITH INDOCYANINE GREEN DYE;  Surgeon: Alexis Frock, MD;  Location: [facility];  Service: Urology;  Laterality: N/A;       No family history on file.  Social History   Tobacco Use   Smoking status:  Never   Smokeless tobacco: Never  Substance Use Topics   Alcohol use: Yes    Comment: occassionally   Drug use: No    Home Medications Prior to Admission medications   Medication Sig Start Date End Date Taking? Authorizing Provider  amLODipine (NORVASC) 10 MG tablet Take 10 mg by mouth every evening.     [provider]  atorvastatin (LIPITOR) 20 MG tablet Take 20 mg by mouth every evening.    [provider]  ciprofloxacin (CIPRO) 500 MG tablet Take 1 tablet (500 mg total) by mouth 2 (two) times daily. Start day prior to office visit for foley removal Patient not taking: Reported on 04/18/2016 04/10/16   Debbrah Alar, PA-C  HYDROcodone-acetaminophen (NORCO) 5-325 MG tablet Take 1-2 tablets by mouth every 6 (six) hours as needed for moderate pain or severe pain. 04/10/16   Debbrah Alar, PA-C  lisinopril (PRINIVIL,ZESTRIL) 10 MG tablet Take 10 mg by mouth every evening.     [provider]  metFORMIN (GLUCOPHAGE) 500 MG tablet Take 500-1,000 mg by mouth as directed. 1TAB AT LUNCH 2 TABs IN EVENING    [provider]  metroNIDAZOLE (METROGEL) 0.75 % gel Apply 1 application topically 2 (two) times daily as needed. FOR ROSACEA OUTBREAK (FACE)     [provider]  senna (SENOKOT) 8.6 MG TABS tablet Take 1 tablet (8.6 mg total) by mouth 2 (two) times daily. While taking strong pain meds to prevent constipation. Patient taking  differently: Take 1 tablet by mouth daily as needed for mild constipation. While taking strong pain meds to prevent constipation. 04/11/16   Alexis Frock, MD    Allergies    Sulfa antibiotics  Review of Systems   Review of Systems  Constitutional:  Positive for fatigue.  Respiratory:  Positive for cough.   All other systems reviewed and are negative.  Physical Exam Updated Vital Signs BP (!) 142/69 (BP Location: Right Arm)   Pulse 68   Temp 98.5 F (36.9 C)   Resp 18   SpO2 98%   Physical Exam Vitals and nursing  note reviewed.  Constitutional:      Appearance: Normal appearance.     Comments: Well-appearing  HENT:     Head: Normocephalic.     Nose: Nose normal.     Mouth/Throat:     Mouth: Mucous membranes are moist.  Eyes:     Extraocular Movements: Extraocular movements intact.     Pupils: Pupils are equal, round, and reactive to light.  Cardiovascular:     Rate and Rhythm: Normal rate and regular rhythm.     Pulses: Normal pulses.     Heart sounds: Normal heart sounds.  Pulmonary:     Effort: Pulmonary effort is normal.     Breath sounds: Normal breath sounds.  Abdominal:     General: Abdomen is flat.     Palpations: Abdomen is soft.  Musculoskeletal:        General: Normal range of motion.     Cervical back: Normal range of motion and neck supple.  Skin:    General: Skin is warm.     Capillary Refill: Capillary refill takes less than 2 seconds.  Neurological:     General: No focal deficit present.     Mental Status: He is alert and oriented to person, place, and time.  Psychiatric:        Mood and Affect: Mood normal.        Behavior: Behavior normal.    ED Results / Procedures / Treatments   Labs (all labs ordered are listed, but only abnormal results are displayed) Labs Reviewed  RESP PANEL BY RT-PCR (FLU A&B, COVID) ARPGX2 - Abnormal; Notable for the following components:      Result Value   SARS Coronavirus 2 by RT PCR POSITIVE (*)    All other components within normal limits    EKG None  Radiology DG Chest 1 View  Result Date: 12/26/2020 CLINICAL DATA:  Shortness of breath. EXAM: CHEST  1 VIEW COMPARISON:  Chest x-ray 01/16/2011 FINDINGS: The heart size and mediastinal contours are within normal limits. There is a calcified granuloma in the right upper lobe. The lungs are otherwise clear. The visualized skeletal structures are unremarkable. IMPRESSION: No active disease. Electronically Signed   By: Ronney Asters M.D.   On: 12/26/2020 16:42     Procedures Procedures   Medications Ordered in ED Medications - No data to display  ED Course  I have reviewed the triage vital signs and the nursing notes.  Pertinent labs & imaging results that were available during my care of the patient were reviewed by me and considered in my medical decision making (see chart for details).    MDM Rules/Calculators/A&P                           Mitchell Faison. is a 70 y.o. male here with cough and congestion.  Wife tested positive for COVID.  Patient's COVID test is positive.  Patient is not hypoxic.  Chest x-ray is clear. Patient symptoms are going on for about 7 to 10 days.  Patient does not qualify for Paxlovid.  Stable for discharge   Final Clinical Impression(s) / ED Diagnoses Final diagnoses:  None    Rx / DC Orders ED Discharge Orders     None        Drenda Freeze, MD 12/26/20 1914

## 2021-03-01 IMAGING — RF DG ESOPHAGUS
11 of 13 series · 14 of 24 positions shown · non-contrast
Comparison: 03/13/2026

CLINICAL DATA: Dysphagia.  Food getting stuck.

EXAM:
ESOPHOGRAM / BARIUM SWALLOW / BARIUM TABLET STUDY
TECHNIQUE: Combined double contrast and single contrast examination performed
using effervescent crystals, thick barium liquid, and thin barium
liquid. The patient was observed with fluoroscopy swallowing a 13 mm
barium sulphate tablet.
FLUOROSCOPY TIME:  Fluoroscopy Time:  1 minutes and 30 seconds
Radiation Exposure Index (if provided by the fluoroscopic device):
177 mGy
Number of Acquired Spot Images: 8

[Series 1: sequence · 0.28mm/px · 2 of 10 frames shown (1 of 8)]
[frame 2/10]
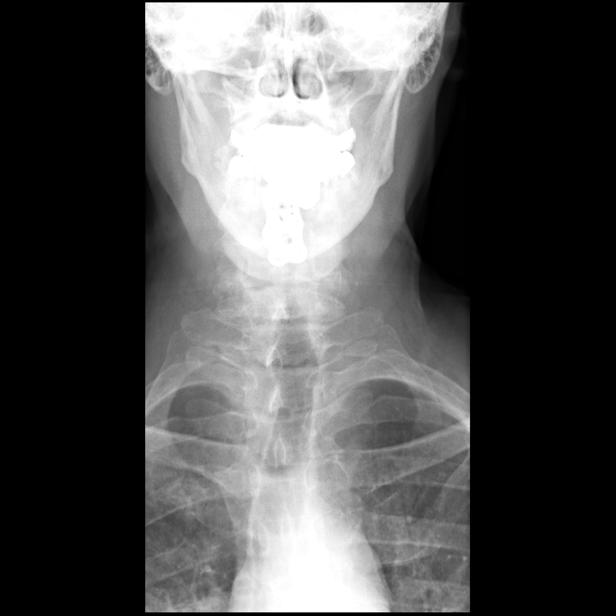
[frame 9/10]
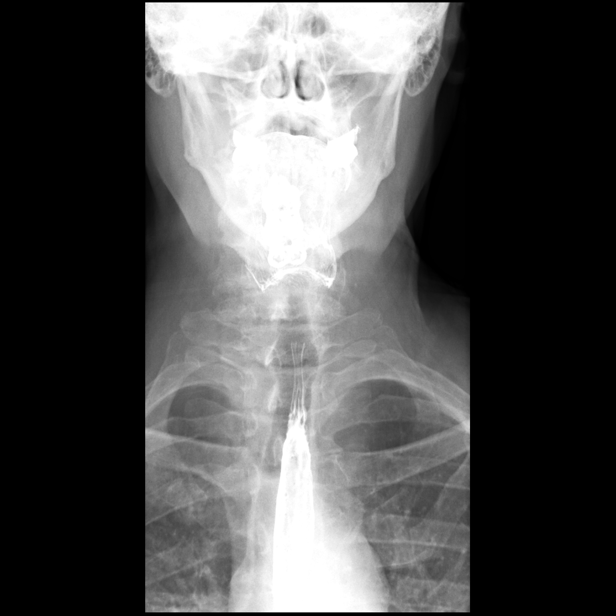

[Series 2: sequence · 0.28mm/px · 1 of 12 frames shown (2 of 8)]
[frame 11/12]
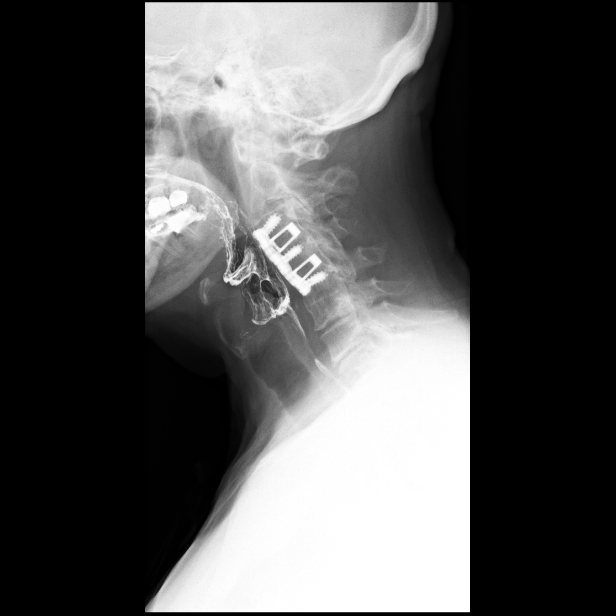

[Series 4: sequence · 0.28mm/px · 2 of 15 frames shown (3 of 8)]
[frame 8/15]
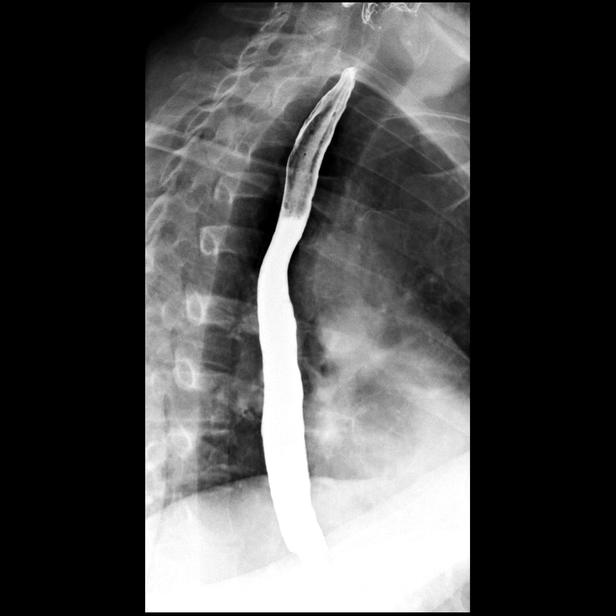
[frame 13/15]
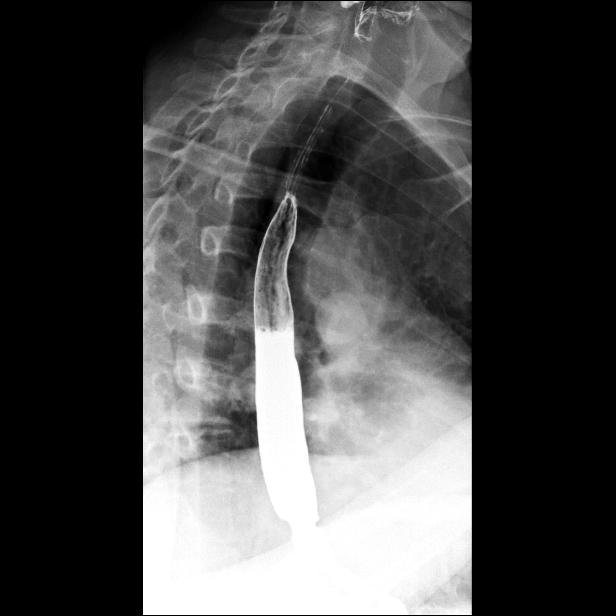

[Series 6: sequence · 0.28mm/px · 1 of 7 frames shown (4 of 8)]
[frame 2/7]
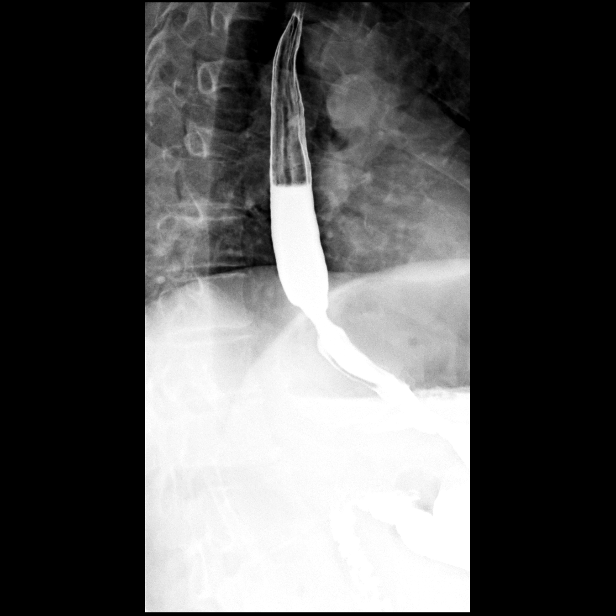

[Series 7: one shot · 1 of 1 slices shown (1 of 3)]
[im 1/1]
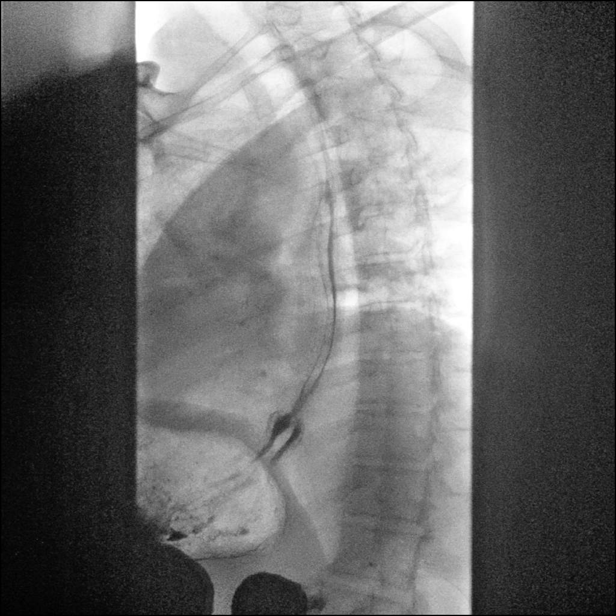

[Series 8: sequence · 1 of 15 frames shown (5 of 8)]
[frame 3/15]
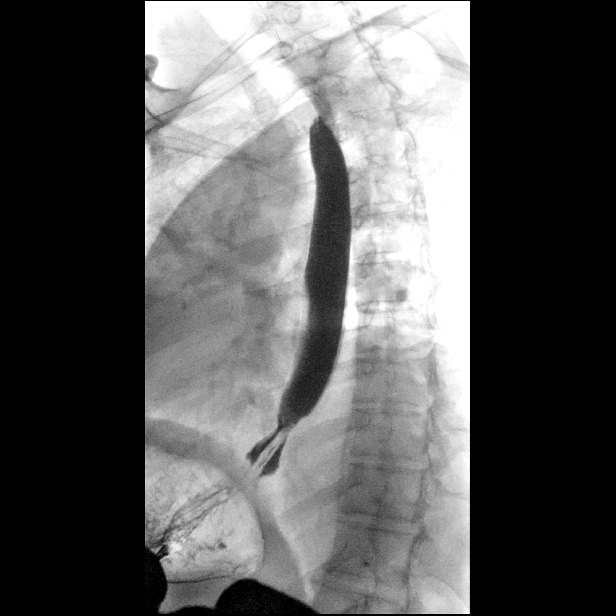

[Series 9: sequence · 2 of 11 frames shown (6 of 8)]
[frame 2/11]
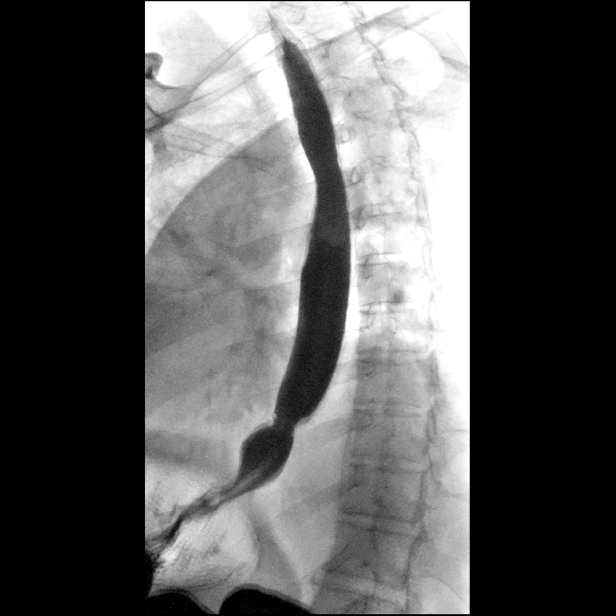
[frame 10/11]
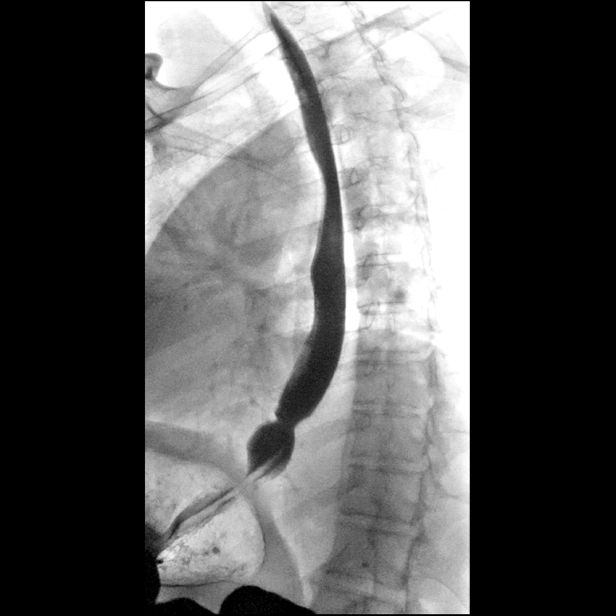

[Series 10: sequence · 1 of 11 frames shown (7 of 8)]
[frame 10/11]
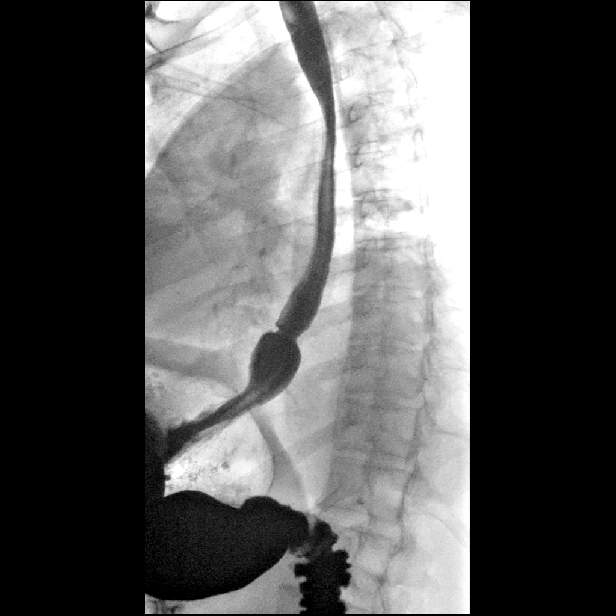

[Series 11: one shot · 1 of 3 slices shown (2 of 3)]
[im 1/3]
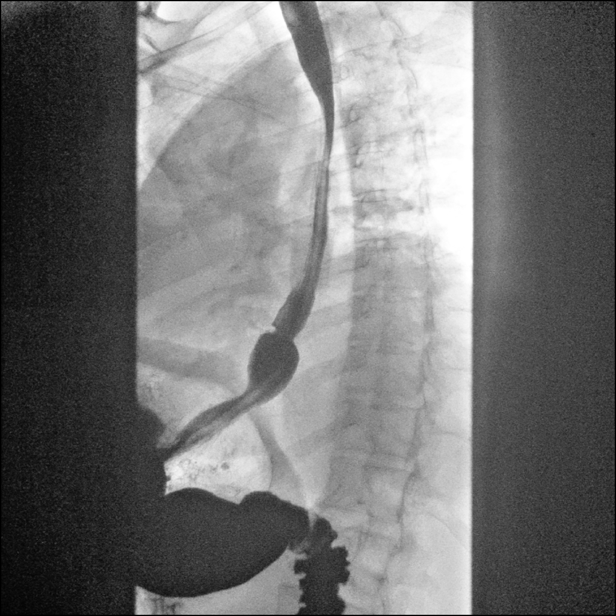

[Series 12: sequence · 1 of 21 frames shown (8 of 8)]
[frame 11/21]
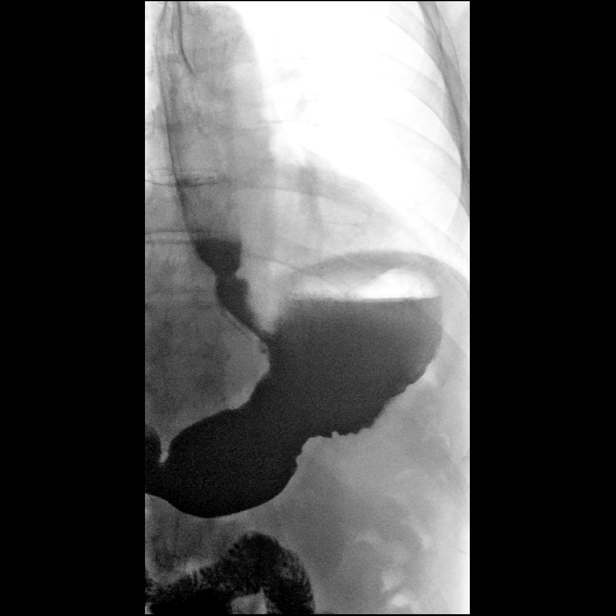

[Series 13: one shot · 1 of 1 slices shown (3 of 3)]
[im 1/1]
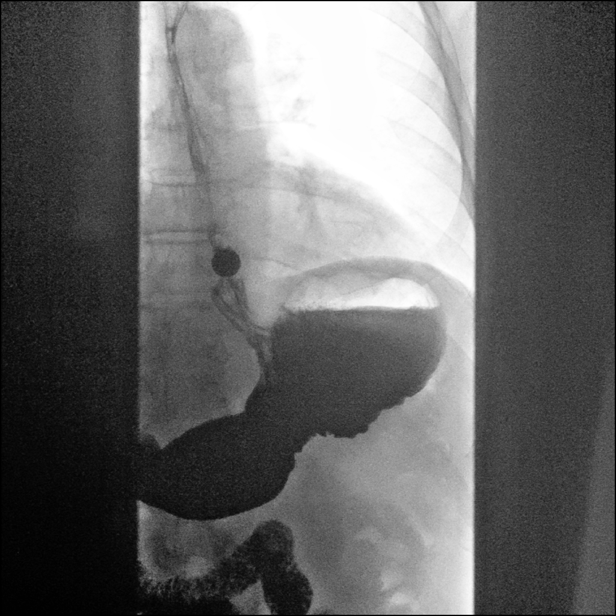

[14 of 24 positions shown; findings below may reference images not displayed]

FINDINGS: Initial barium swallows demonstrate normal pharyngeal motion with
swallowing. No laryngeal penetration or aspiration. No upper
esophageal webs, strictures or diverticuli. Cervical fusion hardware
noted but no significant mass effect on the upper esophagus.

Normal esophageal motility.

There is a fairly tight stricture at the GE junction. Suspect
underlying mucosal B/Schatzki ring. The 13 mm barium pill would not
pass through this area. No GE reflux was demonstrated. No findings
for esophageal mass. Mucosal folds are grossly normal.
IMPRESSION: 1. Normal esophageal motility.
2. Small sliding-type hiatal hernia without demonstrable GE reflux.
3. Tight stricture at the GE junction with underlying mucosal B
ring. The 13 mm barium pill would not pass through this area.

## 2021-03-09 DIAGNOSIS — E7800 Pure hypercholesterolemia, unspecified: Secondary | ICD-10-CM | POA: Diagnosis not present

## 2021-03-09 DIAGNOSIS — Z0000 Encounter for general adult medical examination without abnormal findings: Secondary | ICD-10-CM | POA: Diagnosis not present

## 2021-03-09 DIAGNOSIS — Z23 Encounter for immunization: Secondary | ICD-10-CM | POA: Diagnosis not present

## 2021-03-09 DIAGNOSIS — I10 Essential (primary) hypertension: Secondary | ICD-10-CM | POA: Diagnosis not present

## 2021-03-09 DIAGNOSIS — E1121 Type 2 diabetes mellitus with diabetic nephropathy: Secondary | ICD-10-CM | POA: Diagnosis not present

## 2021-03-09 DIAGNOSIS — Z8546 Personal history of malignant neoplasm of prostate: Secondary | ICD-10-CM | POA: Diagnosis not present

## 2021-03-09 DIAGNOSIS — R1310 Dysphagia, unspecified: Secondary | ICD-10-CM | POA: Diagnosis not present

## 2021-05-30 DIAGNOSIS — F329 Major depressive disorder, single episode, unspecified: Secondary | ICD-10-CM | POA: Diagnosis not present

## 2021-05-30 DIAGNOSIS — Z658 Other specified problems related to psychosocial circumstances: Secondary | ICD-10-CM | POA: Diagnosis not present

## 2021-05-30 DIAGNOSIS — R413 Other amnesia: Secondary | ICD-10-CM | POA: Diagnosis not present

## 2021-07-16 DIAGNOSIS — K2100 Gastro-esophageal reflux disease with esophagitis, without bleeding: Secondary | ICD-10-CM | POA: Diagnosis not present

## 2021-07-16 DIAGNOSIS — F329 Major depressive disorder, single episode, unspecified: Secondary | ICD-10-CM | POA: Diagnosis not present

## 2021-08-20 DIAGNOSIS — R197 Diarrhea, unspecified: Secondary | ICD-10-CM | POA: Diagnosis not present

## 2021-08-20 DIAGNOSIS — K2100 Gastro-esophageal reflux disease with esophagitis, without bleeding: Secondary | ICD-10-CM | POA: Diagnosis not present

## 2021-09-10 DIAGNOSIS — E7800 Pure hypercholesterolemia, unspecified: Secondary | ICD-10-CM | POA: Diagnosis not present

## 2021-09-10 DIAGNOSIS — I10 Essential (primary) hypertension: Secondary | ICD-10-CM | POA: Diagnosis not present

## 2021-09-10 DIAGNOSIS — E1121 Type 2 diabetes mellitus with diabetic nephropathy: Secondary | ICD-10-CM | POA: Diagnosis not present

## 2021-09-10 DIAGNOSIS — K2100 Gastro-esophageal reflux disease with esophagitis, without bleeding: Secondary | ICD-10-CM | POA: Diagnosis not present

## 2021-12-24 DIAGNOSIS — D225 Melanocytic nevi of trunk: Secondary | ICD-10-CM | POA: Diagnosis not present

## 2021-12-24 DIAGNOSIS — L218 Other seborrheic dermatitis: Secondary | ICD-10-CM | POA: Diagnosis not present

## 2021-12-24 DIAGNOSIS — L570 Actinic keratosis: Secondary | ICD-10-CM | POA: Diagnosis not present

## 2021-12-24 DIAGNOSIS — L814 Other melanin hyperpigmentation: Secondary | ICD-10-CM | POA: Diagnosis not present

## 2021-12-24 DIAGNOSIS — Z85828 Personal history of other malignant neoplasm of skin: Secondary | ICD-10-CM | POA: Diagnosis not present

## 2021-12-24 DIAGNOSIS — L821 Other seborrheic keratosis: Secondary | ICD-10-CM | POA: Diagnosis not present

## 2022-03-20 DIAGNOSIS — Z0000 Encounter for general adult medical examination without abnormal findings: Secondary | ICD-10-CM | POA: Diagnosis not present

## 2022-03-20 DIAGNOSIS — E7800 Pure hypercholesterolemia, unspecified: Secondary | ICD-10-CM | POA: Diagnosis not present

## 2022-03-20 DIAGNOSIS — R195 Other fecal abnormalities: Secondary | ICD-10-CM | POA: Diagnosis not present

## 2022-03-20 DIAGNOSIS — K2100 Gastro-esophageal reflux disease with esophagitis, without bleeding: Secondary | ICD-10-CM | POA: Diagnosis not present

## 2022-03-20 DIAGNOSIS — Z8546 Personal history of malignant neoplasm of prostate: Secondary | ICD-10-CM | POA: Diagnosis not present

## 2022-03-20 DIAGNOSIS — R69 Illness, unspecified: Secondary | ICD-10-CM | POA: Diagnosis not present

## 2022-03-20 DIAGNOSIS — I10 Essential (primary) hypertension: Secondary | ICD-10-CM | POA: Diagnosis not present

## 2022-03-20 DIAGNOSIS — Z23 Encounter for immunization: Secondary | ICD-10-CM | POA: Diagnosis not present

## 2022-03-20 DIAGNOSIS — E1121 Type 2 diabetes mellitus with diabetic nephropathy: Secondary | ICD-10-CM | POA: Diagnosis not present

## 2022-04-08 DIAGNOSIS — E875 Hyperkalemia: Secondary | ICD-10-CM | POA: Diagnosis not present

## 2022-08-14 DIAGNOSIS — F488 Other specified nonpsychotic mental disorders: Secondary | ICD-10-CM | POA: Diagnosis not present

## 2022-08-27 DIAGNOSIS — E119 Type 2 diabetes mellitus without complications: Secondary | ICD-10-CM | POA: Diagnosis not present

## 2022-08-27 DIAGNOSIS — H2512 Age-related nuclear cataract, left eye: Secondary | ICD-10-CM | POA: Diagnosis not present

## 2022-08-27 DIAGNOSIS — H52203 Unspecified astigmatism, bilateral: Secondary | ICD-10-CM | POA: Diagnosis not present

## 2022-09-02 DIAGNOSIS — N1832 Chronic kidney disease, stage 3b: Secondary | ICD-10-CM | POA: Diagnosis not present

## 2022-09-02 DIAGNOSIS — I10 Essential (primary) hypertension: Secondary | ICD-10-CM | POA: Diagnosis not present

## 2022-09-02 DIAGNOSIS — E7800 Pure hypercholesterolemia, unspecified: Secondary | ICD-10-CM | POA: Diagnosis not present

## 2022-09-02 DIAGNOSIS — E1121 Type 2 diabetes mellitus with diabetic nephropathy: Secondary | ICD-10-CM | POA: Diagnosis not present

## 2022-09-02 DIAGNOSIS — E1122 Type 2 diabetes mellitus with diabetic chronic kidney disease: Secondary | ICD-10-CM | POA: Diagnosis not present

## 2022-09-20 DIAGNOSIS — E7800 Pure hypercholesterolemia, unspecified: Secondary | ICD-10-CM | POA: Diagnosis not present

## 2022-09-20 DIAGNOSIS — K2100 Gastro-esophageal reflux disease with esophagitis, without bleeding: Secondary | ICD-10-CM | POA: Diagnosis not present

## 2022-09-20 DIAGNOSIS — N1832 Chronic kidney disease, stage 3b: Secondary | ICD-10-CM | POA: Diagnosis not present

## 2022-09-20 DIAGNOSIS — E1122 Type 2 diabetes mellitus with diabetic chronic kidney disease: Secondary | ICD-10-CM | POA: Diagnosis not present

## 2022-09-20 DIAGNOSIS — D649 Anemia, unspecified: Secondary | ICD-10-CM | POA: Diagnosis not present

## 2022-09-20 DIAGNOSIS — I10 Essential (primary) hypertension: Secondary | ICD-10-CM | POA: Diagnosis not present

## 2022-10-07 DIAGNOSIS — D649 Anemia, unspecified: Secondary | ICD-10-CM | POA: Diagnosis not present

## 2022-12-30 DIAGNOSIS — Z85828 Personal history of other malignant neoplasm of skin: Secondary | ICD-10-CM | POA: Diagnosis not present

## 2022-12-30 DIAGNOSIS — D225 Melanocytic nevi of trunk: Secondary | ICD-10-CM | POA: Diagnosis not present

## 2022-12-30 DIAGNOSIS — L570 Actinic keratosis: Secondary | ICD-10-CM | POA: Diagnosis not present

## 2022-12-30 DIAGNOSIS — L821 Other seborrheic keratosis: Secondary | ICD-10-CM | POA: Diagnosis not present

## 2022-12-30 DIAGNOSIS — L820 Inflamed seborrheic keratosis: Secondary | ICD-10-CM | POA: Diagnosis not present

## 2022-12-30 DIAGNOSIS — L814 Other melanin hyperpigmentation: Secondary | ICD-10-CM | POA: Diagnosis not present

## 2022-12-30 DIAGNOSIS — D2262 Melanocytic nevi of left upper limb, including shoulder: Secondary | ICD-10-CM | POA: Diagnosis not present

## 2022-12-30 DIAGNOSIS — D2261 Melanocytic nevi of right upper limb, including shoulder: Secondary | ICD-10-CM | POA: Diagnosis not present

## 2023-03-24 DIAGNOSIS — N1832 Chronic kidney disease, stage 3b: Secondary | ICD-10-CM | POA: Diagnosis not present

## 2023-04-04 DIAGNOSIS — E7800 Pure hypercholesterolemia, unspecified: Secondary | ICD-10-CM | POA: Diagnosis not present

## 2023-04-04 DIAGNOSIS — I10 Essential (primary) hypertension: Secondary | ICD-10-CM | POA: Diagnosis not present

## 2023-04-04 DIAGNOSIS — M25512 Pain in left shoulder: Secondary | ICD-10-CM | POA: Diagnosis not present

## 2023-04-04 DIAGNOSIS — K2100 Gastro-esophageal reflux disease with esophagitis, without bleeding: Secondary | ICD-10-CM | POA: Diagnosis not present

## 2023-04-04 DIAGNOSIS — F329 Major depressive disorder, single episode, unspecified: Secondary | ICD-10-CM | POA: Diagnosis not present

## 2023-04-04 DIAGNOSIS — E1121 Type 2 diabetes mellitus with diabetic nephropathy: Secondary | ICD-10-CM | POA: Diagnosis not present

## 2023-04-04 DIAGNOSIS — D649 Anemia, unspecified: Secondary | ICD-10-CM | POA: Diagnosis not present

## 2023-04-04 DIAGNOSIS — N529 Male erectile dysfunction, unspecified: Secondary | ICD-10-CM | POA: Diagnosis not present

## 2023-04-04 DIAGNOSIS — Z8546 Personal history of malignant neoplasm of prostate: Secondary | ICD-10-CM | POA: Diagnosis not present

## 2023-04-04 DIAGNOSIS — N1832 Chronic kidney disease, stage 3b: Secondary | ICD-10-CM | POA: Diagnosis not present

## 2023-04-04 DIAGNOSIS — Z0000 Encounter for general adult medical examination without abnormal findings: Secondary | ICD-10-CM | POA: Diagnosis not present

## 2023-04-04 DIAGNOSIS — N179 Acute kidney failure, unspecified: Secondary | ICD-10-CM | POA: Diagnosis not present

## 2023-04-28 DIAGNOSIS — M25512 Pain in left shoulder: Secondary | ICD-10-CM | POA: Diagnosis not present

## 2023-05-16 DIAGNOSIS — I10 Essential (primary) hypertension: Secondary | ICD-10-CM | POA: Diagnosis not present

## 2023-08-07 DIAGNOSIS — E11649 Type 2 diabetes mellitus with hypoglycemia without coma: Secondary | ICD-10-CM | POA: Diagnosis not present

## 2023-08-07 DIAGNOSIS — E118 Type 2 diabetes mellitus with unspecified complications: Secondary | ICD-10-CM | POA: Diagnosis not present

## 2023-08-07 DIAGNOSIS — R634 Abnormal weight loss: Secondary | ICD-10-CM | POA: Diagnosis not present

## 2023-08-11 DIAGNOSIS — N1832 Chronic kidney disease, stage 3b: Secondary | ICD-10-CM | POA: Diagnosis not present

## 2023-08-11 DIAGNOSIS — I10 Essential (primary) hypertension: Secondary | ICD-10-CM | POA: Diagnosis not present

## 2023-08-11 DIAGNOSIS — E7800 Pure hypercholesterolemia, unspecified: Secondary | ICD-10-CM | POA: Diagnosis not present

## 2023-08-11 DIAGNOSIS — E1121 Type 2 diabetes mellitus with diabetic nephropathy: Secondary | ICD-10-CM | POA: Diagnosis not present

## 2023-08-11 DIAGNOSIS — K2100 Gastro-esophageal reflux disease with esophagitis, without bleeding: Secondary | ICD-10-CM | POA: Diagnosis not present

## 2023-10-07 DIAGNOSIS — E118 Type 2 diabetes mellitus with unspecified complications: Secondary | ICD-10-CM | POA: Diagnosis not present

## 2023-10-07 DIAGNOSIS — N1832 Chronic kidney disease, stage 3b: Secondary | ICD-10-CM | POA: Diagnosis not present

## 2023-10-07 DIAGNOSIS — E7800 Pure hypercholesterolemia, unspecified: Secondary | ICD-10-CM | POA: Diagnosis not present

## 2023-10-07 DIAGNOSIS — I10 Essential (primary) hypertension: Secondary | ICD-10-CM | POA: Diagnosis not present

## 2023-10-07 DIAGNOSIS — R634 Abnormal weight loss: Secondary | ICD-10-CM | POA: Diagnosis not present

## 2023-10-09 DIAGNOSIS — N1832 Chronic kidney disease, stage 3b: Secondary | ICD-10-CM | POA: Diagnosis not present

## 2023-10-09 DIAGNOSIS — I10 Essential (primary) hypertension: Secondary | ICD-10-CM | POA: Diagnosis not present

## 2023-10-09 DIAGNOSIS — E7800 Pure hypercholesterolemia, unspecified: Secondary | ICD-10-CM | POA: Diagnosis not present

## 2023-10-09 DIAGNOSIS — R634 Abnormal weight loss: Secondary | ICD-10-CM | POA: Diagnosis not present

## 2023-10-09 DIAGNOSIS — E118 Type 2 diabetes mellitus with unspecified complications: Secondary | ICD-10-CM | POA: Diagnosis not present
